# Patient Record
Sex: Male | Born: 1950 | Race: White | Hispanic: No | Marital: Married | Smoking: Never smoker
Health system: Southern US, Community
[De-identification: ages and names within clinical notes are randomized; demographics above are authoritative.]

## PROBLEM LIST (undated history)

## (undated) DIAGNOSIS — K219 Gastro-esophageal reflux disease without esophagitis: Secondary | ICD-10-CM

## (undated) DIAGNOSIS — T7840XA Allergy, unspecified, initial encounter: Secondary | ICD-10-CM

## (undated) DIAGNOSIS — I10 Essential (primary) hypertension: Secondary | ICD-10-CM

## (undated) DIAGNOSIS — H409 Unspecified glaucoma: Secondary | ICD-10-CM

## (undated) HISTORY — PX: APPENDECTOMY: SHX54

## (undated) HISTORY — PX: HERNIA REPAIR: SHX51

## (undated) HISTORY — DX: Unspecified glaucoma: H40.9

## (undated) HISTORY — DX: Essential (primary) hypertension: I10

## (undated) HISTORY — DX: Allergy, unspecified, initial encounter: T78.40XA

## (undated) HISTORY — DX: Gastro-esophageal reflux disease without esophagitis: K21.9

---

## 2000-03-17 ENCOUNTER — Encounter: Payer: Self-pay | Admitting: Internal Medicine

## 2000-03-17 ENCOUNTER — Ambulatory Visit (HOSPITAL_COMMUNITY): Admission: RE | Admit: 2000-03-17 | Discharge: 2000-03-17 | Payer: Self-pay | Admitting: Internal Medicine

## 2014-09-03 ENCOUNTER — Other Ambulatory Visit (INDEPENDENT_AMBULATORY_CARE_PROVIDER_SITE_OTHER): Payer: 59

## 2014-09-03 ENCOUNTER — Telehealth: Payer: Self-pay

## 2014-09-03 ENCOUNTER — Encounter: Payer: Self-pay | Admitting: Internal Medicine

## 2014-09-03 ENCOUNTER — Ambulatory Visit (INDEPENDENT_AMBULATORY_CARE_PROVIDER_SITE_OTHER): Payer: 59 | Admitting: Internal Medicine

## 2014-09-03 VITALS — BP 132/84 | HR 55 | Temp 97.9°F | Resp 16 | Wt 203.0 lb

## 2014-09-03 DIAGNOSIS — R202 Paresthesia of skin: Secondary | ICD-10-CM | POA: Diagnosis not present

## 2014-09-03 DIAGNOSIS — Z23 Encounter for immunization: Secondary | ICD-10-CM

## 2014-09-03 DIAGNOSIS — N529 Male erectile dysfunction, unspecified: Secondary | ICD-10-CM | POA: Diagnosis not present

## 2014-09-03 DIAGNOSIS — Z0000 Encounter for general adult medical examination without abnormal findings: Secondary | ICD-10-CM

## 2014-09-03 LAB — COMPREHENSIVE METABOLIC PANEL
ALBUMIN: 4.5 g/dL (ref 3.5–5.2)
ALT: 19 U/L (ref 0–53)
AST: 20 U/L (ref 0–37)
Alkaline Phosphatase: 55 U/L (ref 39–117)
BUN: 19 mg/dL (ref 6–23)
CHLORIDE: 106 meq/L (ref 96–112)
CO2: 29 meq/L (ref 19–32)
CREATININE: 1.07 mg/dL (ref 0.40–1.50)
Calcium: 9.6 mg/dL (ref 8.4–10.5)
GFR: 74.09 mL/min (ref >60.00)
GLUCOSE: 130 mg/dL — AB (ref 70–99)
POTASSIUM: 4.6 meq/L (ref 3.5–5.1)
SODIUM: 142 meq/L (ref 135–145)
Total Bilirubin: 0.6 mg/dL (ref 0.2–1.2)
Total Protein: 7.4 g/dL (ref 6.0–8.3)

## 2014-09-03 LAB — CBC WITH DIFFERENTIAL/PLATELET
BASOS PCT: 0.4 % (ref 0.0–3.0)
Basophils Absolute: 0 10*3/uL (ref 0.0–0.1)
EOS ABS: 0.1 10*3/uL (ref 0.0–0.7)
EOS PCT: 1.8 % (ref 0.0–5.0)
HCT: 47.9 % (ref 39.0–52.0)
Hemoglobin: 16.2 g/dL (ref 13.0–17.0)
LYMPHS ABS: 1.8 10*3/uL (ref 0.7–4.0)
Lymphocytes Relative: 29.7 % (ref 12.0–46.0)
MCHC: 33.9 g/dL (ref 30.0–36.0)
MCV: 88.8 fl (ref 78.0–100.0)
MONO ABS: 0.6 10*3/uL (ref 0.1–1.0)
Monocytes Relative: 9.1 % (ref 3.0–12.0)
NEUTROS ABS: 3.6 10*3/uL (ref 1.4–7.7)
Neutrophils Relative %: 59 % (ref 43.0–77.0)
PLATELETS: 217 10*3/uL (ref 150.0–400.0)
RBC: 5.4 Mil/uL (ref 4.22–5.81)
RDW: 13 % (ref 11.5–15.5)
WBC: 6.1 10*3/uL (ref 4.0–10.5)

## 2014-09-03 LAB — URINALYSIS, ROUTINE W REFLEX MICROSCOPIC
Bilirubin Urine: NEGATIVE
HGB URINE DIPSTICK: NEGATIVE
Ketones, ur: NEGATIVE
Leukocytes, UA: NEGATIVE
Nitrite: NEGATIVE
RBC / HPF: NONE SEEN (ref 0–?)
SPECIFIC GRAVITY, URINE: 1.025 (ref 1.000–1.030)
TOTAL PROTEIN, URINE-UPE24: NEGATIVE
URINE GLUCOSE: NEGATIVE
Urobilinogen, UA: 0.2 (ref 0.0–1.0)
pH: 5.5 (ref 5.0–8.0)

## 2014-09-03 LAB — SEDIMENTATION RATE: SED RATE: 12 mm/h (ref 0–22)

## 2014-09-03 LAB — LIPID PANEL
Cholesterol: 212 mg/dL — ABNORMAL HIGH (ref 0–200)
HDL: 42.4 mg/dL (ref >39.00)
LDL Cholesterol: 145 mg/dL — ABNORMAL HIGH (ref 0–99)
NONHDL: 169.68
Total CHOL/HDL Ratio: 5
Triglycerides: 123 mg/dL (ref 0.0–149.0)
VLDL: 24.6 mg/dL (ref 0.0–40.0)

## 2014-09-03 LAB — TESTOSTERONE: Testosterone: 435.71 ng/dL (ref 300.00–890.00)

## 2014-09-03 LAB — VITAMIN B12: VITAMIN B 12: 620 pg/mL (ref 211–911)

## 2014-09-03 LAB — PSA: PSA: 1.87 ng/mL (ref 0.10–4.00)

## 2014-09-03 LAB — VITAMIN D 25 HYDROXY (VIT D DEFICIENCY, FRACTURES): VITD: 74.41 ng/mL (ref 30.00–100.00)

## 2014-09-03 LAB — TSH: TSH: 1.4 u[IU]/mL (ref 0.35–4.50)

## 2014-09-03 MED ORDER — VARDENAFIL HCL 20 MG PO TABS
20.0000 mg | ORAL_TABLET | Freq: Every day | ORAL | Status: DC | PRN
Start: 2014-09-03 — End: 2017-05-01

## 2014-09-03 MED ORDER — VITAMIN D 1000 UNITS PO TABS: 1000.0000 [IU] | ORAL_TABLET | Freq: Every day | ORAL | Status: AC

## 2014-09-03 NOTE — Telephone Encounter (Signed)
Lab called and needed labs entered for this pt.   Labs are entered - they were able to tell me the labs that were written by PCP.   RE: [facility] was not connected for a part of the morning, ie, computer issues.

## 2014-09-03 NOTE — Progress Notes (Signed)
Pre visit review using our clinic review tool, if applicable. No additional management support is needed unless otherwise documented below in the visit note. 

## 2014-09-03 NOTE — Assessment & Plan Note (Signed)
X 2 years Levitra prn Labs

## 2014-09-03 NOTE — Progress Notes (Signed)
Subjective:     Patient ID: Brent Wright, male   DOB: 1950/09/13, 64 y.o.   MRN: [id number]  HPI   Well exam - new pt (not seen in >10 years) C/o "cold feet" sensation x 2 years C/o ED x 2-3 years   Review of Systems  Constitutional: Positive for diaphoresis. Negative for appetite change, fatigue and unexpected weight change.  HENT: Negative for congestion, facial swelling, nosebleeds, sneezing, sore throat, trouble swallowing and voice change.   Eyes: Negative for redness, itching and visual disturbance.  Respiratory: Negative for cough and wheezing.   Cardiovascular: Negative for chest pain, palpitations and leg swelling.  Gastrointestinal: Negative for nausea, vomiting, diarrhea, blood in stool and abdominal distention.  Genitourinary: Negative for dysuria, frequency, hematuria, decreased urine volume, penile swelling, scrotal swelling, enuresis, penile pain and testicular pain.  Musculoskeletal: Negative for back pain, joint swelling, gait problem and neck pain.  Skin: Negative for color change, pallor, rash and wound.  Neurological: Negative for dizziness, tremors, speech difficulty, weakness and light-headedness.  Psychiatric/Behavioral: Negative for suicidal ideas, confusion, sleep disturbance, dysphoric mood, decreased concentration and agitation. The patient is not nervous/anxious.        Objective:   Physical Exam  Constitutional: He is oriented to person, place, and time. He appears well-developed and well-nourished. No distress.  HENT:  Head: Normocephalic and atraumatic.  Right Ear: External ear normal.  Left Ear: External ear normal.  Nose: Nose normal.  Mouth/Throat: Oropharynx is clear and moist. No oropharyngeal exudate.  Eyes: Conjunctivae and EOM are normal. Pupils are equal, round, and reactive to light. Right eye exhibits no discharge. Left eye exhibits no discharge. No scleral icterus.  Neck: Normal range of motion. Neck supple. No JVD present. No tracheal  deviation present. No thyromegaly present.  Cardiovascular: Normal rate, regular rhythm, normal heart sounds and intact distal pulses.  Exam reveals no gallop and no friction rub.   No murmur heard. Pulmonary/Chest: Effort normal and breath sounds normal. No stridor. No respiratory distress. He has no wheezes. He has no rales. He exhibits no tenderness.  Abdominal: Soft. Bowel sounds are normal. He exhibits no distension and no mass. There is no tenderness. There is no rebound and no guarding.  Genitourinary: Rectum normal and penis normal. Guaiac negative stool. No penile tenderness.  Prostate 1+ Testes - a little atrophic  Musculoskeletal: Normal range of motion. He exhibits no edema or tenderness.  Lymphadenopathy:    He has no cervical adenopathy.  Neurological: He is alert and oriented to person, place, and time. He has normal reflexes. No cranial nerve deficit. He exhibits normal muscle tone. Coordination normal.  Skin: Skin is warm and dry. No rash noted. He is not diaphoretic. No erythema. No pallor.  Psychiatric: He has a normal mood and affect. His behavior is normal. Judgment and thought content normal.       Assessment:         Plan:

## 2014-09-03 NOTE — Assessment & Plan Note (Signed)
2016 poss neuropathy ?etiology Labs

## 2014-09-04 ENCOUNTER — Encounter: Payer: Self-pay | Admitting: Internal Medicine

## 2014-09-04 NOTE — Assessment & Plan Note (Signed)
We discussed age appropriate health related issues, including available/recomended screening tests and vaccinations. We discussed a need for adhering to healthy diet and exercise. Labs/EKG were reviewed/ordered. All questions were answered. Colonoscopy vs cologuard discussed

## 2017-05-01 ENCOUNTER — Ambulatory Visit (INDEPENDENT_AMBULATORY_CARE_PROVIDER_SITE_OTHER)
Admission: RE | Admit: 2017-05-01 | Discharge: 2017-05-01 | Disposition: A | Discharge status: Home / Self Care | Payer: Medicare Other | Source: Ambulatory Visit | Attending: Internal Medicine | Admitting: Internal Medicine

## 2017-05-01 ENCOUNTER — Ambulatory Visit (INDEPENDENT_AMBULATORY_CARE_PROVIDER_SITE_OTHER): Payer: Medicare Other | Admitting: Internal Medicine

## 2017-05-01 ENCOUNTER — Encounter: Payer: Self-pay | Admitting: Internal Medicine

## 2017-05-01 ENCOUNTER — Other Ambulatory Visit (INDEPENDENT_AMBULATORY_CARE_PROVIDER_SITE_OTHER): Payer: Medicare Other

## 2017-05-01 VITALS — BP 142/86 | HR 60 | Temp 97.9°F | Ht 69.0 in | Wt 201.0 lb

## 2017-05-01 DIAGNOSIS — R0789 Other chest pain: Secondary | ICD-10-CM

## 2017-05-01 DIAGNOSIS — M542 Cervicalgia: Secondary | ICD-10-CM

## 2017-05-01 DIAGNOSIS — R079 Chest pain, unspecified: Secondary | ICD-10-CM | POA: Diagnosis not present

## 2017-05-01 LAB — CBC WITH DIFFERENTIAL/PLATELET
BASOS ABS: 0.1 10*3/uL (ref 0.0–0.1)
Basophils Relative: 0.7 % (ref 0.0–3.0)
EOS ABS: 0.1 10*3/uL (ref 0.0–0.7)
Eosinophils Relative: 1.7 % (ref 0.0–5.0)
HCT: 43.7 % (ref 39.0–52.0)
Hemoglobin: 14.9 g/dL (ref 13.0–17.0)
LYMPHS ABS: 2.3 10*3/uL (ref 0.7–4.0)
Lymphocytes Relative: 30.7 % (ref 12.0–46.0)
MCHC: 34.1 g/dL (ref 30.0–36.0)
MCV: 87.8 fl (ref 78.0–100.0)
MONO ABS: 0.6 10*3/uL (ref 0.1–1.0)
Monocytes Relative: 8.4 % (ref 3.0–12.0)
NEUTROS PCT: 58.5 % (ref 43.0–77.0)
Neutro Abs: 4.4 10*3/uL (ref 1.4–7.7)
Platelets: 206 10*3/uL (ref 150.0–400.0)
RBC: 4.98 Mil/uL (ref 4.22–5.81)
RDW: 13.2 % (ref 11.5–15.5)
WBC: 7.5 10*3/uL (ref 4.0–10.5)

## 2017-05-01 LAB — URINALYSIS
Bilirubin Urine: NEGATIVE
Hgb urine dipstick: NEGATIVE
Ketones, ur: NEGATIVE
LEUKOCYTES UA: NEGATIVE
Nitrite: NEGATIVE
PH: 5 (ref 5.0–8.0)
Total Protein, Urine: NEGATIVE
UROBILINOGEN UA: 0.2 (ref 0.0–1.0)
Urine Glucose: NEGATIVE

## 2017-05-01 LAB — H. PYLORI ANTIBODY, IGG: H Pylori IgG: POSITIVE — AB

## 2017-05-01 MED ORDER — PANTOPRAZOLE SODIUM 40 MG PO TBEC: 40.0000 mg | DELAYED_RELEASE_TABLET | Freq: Every day | ORAL | 3 refills | Status: DC
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
## 2017-05-01 NOTE — Assessment & Plan Note (Addendum)
?  etiology EKG CXR Labs incl H pylori Stress ECHO  ASA 81 mg/d Poss MSK vs GERD: Protonix empirically RTC 6 weeks To ER if worse

## 2017-05-02 LAB — LIPID PANEL
CHOLESTEROL: 181 mg/dL (ref 0–200)
HDL: 42.7 mg/dL (ref >39.00)
LDL CALC: 117 mg/dL — AB (ref 0–99)
NonHDL: 138.47
Total CHOL/HDL Ratio: 4
Triglycerides: 106 mg/dL (ref 0.0–149.0)
VLDL: 21.2 mg/dL (ref 0.0–40.0)

## 2017-05-02 LAB — TSH: TSH: 1.37 u[IU]/mL (ref 0.35–4.50)

## 2017-05-02 LAB — HEPATIC FUNCTION PANEL
ALK PHOS: 55 U/L (ref 39–117)
ALT: 18 U/L (ref 0–53)
AST: 18 U/L (ref 0–37)
Albumin: 4.3 g/dL (ref 3.5–5.2)
Bilirubin, Direct: 0 mg/dL (ref 0.0–0.3)
TOTAL PROTEIN: 7.5 g/dL (ref 6.0–8.3)
Total Bilirubin: 0.5 mg/dL (ref 0.2–1.2)

## 2017-05-02 LAB — BASIC METABOLIC PANEL
BUN: 22 mg/dL (ref 6–23)
CHLORIDE: 104 meq/L (ref 96–112)
CO2: 23 meq/L (ref 19–32)
Calcium: 9.4 mg/dL (ref 8.4–10.5)
Creatinine, Ser: 1.23 mg/dL (ref 0.40–1.50)
GFR: 62.56 mL/min (ref >60.00)
Glucose, Bld: 114 mg/dL — ABNORMAL HIGH (ref 70–99)
POTASSIUM: 4.1 meq/L (ref 3.5–5.1)
SODIUM: 139 meq/L (ref 135–145)

## 2017-05-07 ENCOUNTER — Other Ambulatory Visit: Payer: Self-pay | Admitting: Internal Medicine

## 2017-05-07 MED ORDER — CLARITHROMYCIN 500 MG PO TABS: 500.0000 mg | ORAL_TABLET | Freq: Two times a day (BID) | ORAL | 0 refills | Status: AC

## 2017-05-07 MED ORDER — AMOXICILLIN 500 MG PO CAPS: 1000.0000 mg | ORAL_CAPSULE | Freq: Two times a day (BID) | ORAL | 0 refills | Status: DC

## 2017-05-07 MED ORDER — PANTOPRAZOLE SODIUM 40 MG PO TBEC: 40.0000 mg | DELAYED_RELEASE_TABLET | Freq: Two times a day (BID) | ORAL | 1 refills | Status: DC

## 2017-05-09 ENCOUNTER — Telehealth: Payer: Self-pay

## 2017-05-09 NOTE — Telephone Encounter (Signed)
-----   Message from Cassandria Anger, MD sent at 05/07/2017 11:15 PM EDT ----- Regarding: rx Inez Catalina, Please fax his Rx x3 to his drug store (none on file) Thanks, AP

## 2017-05-09 NOTE — Telephone Encounter (Signed)
LM for pt to call back.  Where does pt want RXs to go or does he ant to pick up Rxs

## 2017-05-11 ENCOUNTER — Telehealth (HOSPITAL_COMMUNITY): Payer: Self-pay | Admitting: *Deleted

## 2017-05-11 NOTE — Telephone Encounter (Signed)
Patient given detailed instructions per Stress Test Requisition Sheet for test on 05/18/17 at 7:30.Patient Notified to arrive 30 minutes early, and that it is imperative to arrive on time for appointment to keep from having the test rescheduled.  Patient verbalized understanding. Brent Wright

## 2017-05-18 ENCOUNTER — Other Ambulatory Visit: Payer: Self-pay

## 2017-05-18 ENCOUNTER — Ambulatory Visit (HOSPITAL_COMMUNITY): Payer: Medicare Other

## 2017-05-18 ENCOUNTER — Ambulatory Visit (HOSPITAL_COMMUNITY): Payer: Medicare Other | Attending: Cardiology

## 2017-05-18 DIAGNOSIS — R0789 Other chest pain: Secondary | ICD-10-CM | POA: Diagnosis not present

## 2017-05-18 DIAGNOSIS — M542 Cervicalgia: Secondary | ICD-10-CM | POA: Diagnosis not present

## 2017-05-18 DIAGNOSIS — I10 Essential (primary) hypertension: Secondary | ICD-10-CM | POA: Diagnosis not present

## 2017-05-18 DIAGNOSIS — R079 Chest pain, unspecified: Secondary | ICD-10-CM | POA: Diagnosis not present

## 2017-05-19 ENCOUNTER — Encounter (INDEPENDENT_AMBULATORY_CARE_PROVIDER_SITE_OTHER): Payer: Self-pay

## 2017-06-21 DIAGNOSIS — H43813 Vitreous degeneration, bilateral: Secondary | ICD-10-CM | POA: Diagnosis not present

## 2017-06-21 DIAGNOSIS — H2513 Age-related nuclear cataract, bilateral: Secondary | ICD-10-CM | POA: Diagnosis not present

## 2017-06-21 DIAGNOSIS — H5213 Myopia, bilateral: Secondary | ICD-10-CM | POA: Diagnosis not present

## 2017-06-21 DIAGNOSIS — H40023 Open angle with borderline findings, high risk, bilateral: Secondary | ICD-10-CM | POA: Diagnosis not present

## 2017-06-21 DIAGNOSIS — H40053 Ocular hypertension, bilateral: Secondary | ICD-10-CM | POA: Diagnosis not present

## 2017-06-23 ENCOUNTER — Ambulatory Visit (INDEPENDENT_AMBULATORY_CARE_PROVIDER_SITE_OTHER): Payer: Medicare Other | Admitting: Internal Medicine

## 2017-06-23 ENCOUNTER — Encounter: Payer: Self-pay | Admitting: Internal Medicine

## 2017-06-23 VITALS — BP 138/82 | HR 54 | Temp 98.3°F | Ht 69.0 in | Wt 196.0 lb

## 2017-06-23 DIAGNOSIS — Z23 Encounter for immunization: Secondary | ICD-10-CM

## 2017-06-23 DIAGNOSIS — R0789 Other chest pain: Secondary | ICD-10-CM | POA: Diagnosis not present

## 2017-06-23 DIAGNOSIS — A048 Other specified bacterial intestinal infections: Secondary | ICD-10-CM | POA: Diagnosis not present

## 2017-06-23 MED ORDER — VITAMIN D3 50 MCG (2000 UT) PO CAPS: 2000.0000 [IU] | ORAL_CAPSULE | Freq: Every day | ORAL | 3 refills | Status: AC

## 2017-06-23 NOTE — Assessment & Plan Note (Signed)
Resolved w/H pylori Rx

## 2017-06-23 NOTE — Progress Notes (Signed)
Subjective:  Patient ID: Brent Wright, male    DOB: 11-Oct-1950  Age: 67 y.o. MRN: [id number]  CC: No chief complaint on file.   HPI Brent Wright presents for H pylori, CP - much better after abx   Outpatient Medications Prior to Visit  Medication Sig Dispense Refill  . aspirin 81 MG tablet Take 81 mg by mouth daily.    . pantoprazole (PROTONIX) 40 MG tablet Take 1 tablet (40 mg total) by mouth 2 (two) times daily. 60 tablet 1  . amoxicillin (AMOXIL) 500 MG capsule Take 2 capsules (1,000 mg total) by mouth 2 (two) times daily. 56 capsule 0   No facility-administered medications prior to visit.     ROS: Review of Systems  Constitutional: Negative for appetite change, fatigue and unexpected weight change.  HENT: Negative for congestion, nosebleeds, sneezing, sore throat and trouble swallowing.   Eyes: Negative for itching and visual disturbance.  Respiratory: Negative for cough.   Cardiovascular: Negative for chest pain, palpitations and leg swelling.  Gastrointestinal: Negative for abdominal distention, blood in stool, diarrhea and nausea.  Genitourinary: Negative for frequency and hematuria.  Musculoskeletal: Negative for back pain, gait problem, joint swelling and neck pain.  Skin: Negative for rash.  Neurological: Negative for dizziness, tremors, speech difficulty and weakness.  Psychiatric/Behavioral: Negative for agitation, dysphoric mood and sleep disturbance. The patient is not nervous/anxious.     Objective:  BP 138/82 (BP Location: Left Arm, Patient Position: Sitting, Cuff Size: Large)   Pulse (!) 54   Temp 98.3 F (36.8 C) (Oral)   Ht 5\' 9"  (1.753 m)   Wt 196 lb (88.9 kg)   SpO2 98%   BMI 28.94 kg/m   BP Readings from Last 3 Encounters:  06/23/17 138/82  05/01/17 (!) 142/86  09/03/14 132/84    Wt Readings from Last 3 Encounters:  06/23/17 196 lb (88.9 kg)  05/01/17 201 lb (91.2 kg)  09/03/14 203 lb (92.1 kg)    Physical Exam  Constitutional: He is  oriented to person, place, and time. He appears well-developed. No distress.  NAD  HENT:  Mouth/Throat: Oropharynx is clear and moist.  Eyes: Pupils are equal, round, and reactive to light. Conjunctivae are normal.  Neck: Normal range of motion. No JVD present. No thyromegaly present.  Cardiovascular: Normal rate, regular rhythm, normal heart sounds and intact distal pulses. Exam reveals no gallop and no friction rub.  No murmur heard. Pulmonary/Chest: Effort normal and breath sounds normal. No respiratory distress. He has no wheezes. He has no rales. He exhibits no tenderness.  Abdominal: Soft. Bowel sounds are normal. He exhibits no distension and no mass. There is no tenderness. There is no rebound and no guarding.  Musculoskeletal: Normal range of motion. He exhibits no edema or tenderness.  Lymphadenopathy:    He has no cervical adenopathy.  Neurological: He is alert and oriented to person, place, and time. He has normal reflexes. No cranial nerve deficit. He exhibits normal muscle tone. He displays a negative Romberg sign. Coordination and gait normal.  Skin: Skin is warm and dry. No rash noted.  Psychiatric: He has a normal mood and affect. His behavior is normal. Judgment and thought content normal.    Lab Results  Component Value Date   WBC 7.5 05/01/2017   HGB 14.9 05/01/2017   HCT 43.7 05/01/2017   PLT 206.0 05/01/2017   GLUCOSE 114 (H) 05/01/2017   CHOL 181 05/01/2017   TRIG 106.0 05/01/2017   HDL 42.70 05/01/2017  LDLCALC 117 (H) 05/01/2017   ALT 18 05/01/2017   AST 18 05/01/2017   NA 139 05/01/2017   K 4.1 05/01/2017   CL 104 05/01/2017   CREATININE 1.23 05/01/2017   BUN 22 05/01/2017   CO2 23 05/01/2017   TSH 1.37 05/01/2017   PSA 1.87 09/03/2014    Dg Chest 2 View  Result Date: 05/02/2017 CLINICAL DATA:  Chest pain for 1 month EXAM: CHEST - 2 VIEW COMPARISON:  None. FINDINGS: Hyperinflation. No focal airspace disease or effusion. Normal heart size. No  pneumothorax. IMPRESSION: No active cardiopulmonary disease. Electronically Signed   By: Donavan Foil M.D.   On: 05/02/2017 03:42    Assessment & Plan:   There are no diagnoses linked to this encounter.   No orders of the defined types were placed in this encounter.    Follow-up: No follow-ups on file.  Walker Kehr, MD

## 2017-06-23 NOTE — Assessment & Plan Note (Signed)
treated

## 2017-06-23 NOTE — Addendum Note (Signed)
Addended by: Karren Cobble on: 06/23/2017 11:19 AM   Modules accepted: Orders

## 2017-07-20 ENCOUNTER — Other Ambulatory Visit: Payer: Self-pay | Admitting: Internal Medicine

## 2017-11-22 ENCOUNTER — Encounter: Payer: Self-pay | Admitting: Internal Medicine

## 2017-11-22 ENCOUNTER — Ambulatory Visit (INDEPENDENT_AMBULATORY_CARE_PROVIDER_SITE_OTHER): Payer: Medicare Other | Admitting: Internal Medicine

## 2017-11-22 DIAGNOSIS — K4090 Unilateral inguinal hernia, without obstruction or gangrene, not specified as recurrent: Secondary | ICD-10-CM

## 2017-11-22 NOTE — Progress Notes (Signed)
Subjective:  Patient ID: Brent Wright, male    DOB: 1950-02-28  Age: 67 y.o. MRN: [id number]  CC: No chief complaint on file.   HPI Akaash Vandewater presents for L groin swelling off and on x2 wks NT  Outpatient Medications Prior to Visit  Medication Sig Dispense Refill  . Cholecalciferol (VITAMIN D3) 2000 units capsule Take 1 capsule (2,000 Units total) by mouth daily. 100 capsule 3  . pantoprazole (PROTONIX) 40 MG tablet TAKE 1 TABLET BY MOUTH TWICE A DAY 60 tablet 1   No facility-administered medications prior to visit.     ROS: Review of Systems  Constitutional: Negative for appetite change, fatigue and unexpected weight change.  HENT: Negative for congestion, nosebleeds, sneezing, sore throat and trouble swallowing.   Eyes: Negative for itching and visual disturbance.  Respiratory: Negative for cough.   Cardiovascular: Negative for chest pain, palpitations and leg swelling.  Gastrointestinal: Negative for abdominal distention, abdominal pain, blood in stool, diarrhea, nausea and vomiting.  Genitourinary: Negative for frequency and hematuria.  Musculoskeletal: Negative for back pain, gait problem, joint swelling and neck pain.  Skin: Negative for rash.  Neurological: Negative for dizziness, tremors, speech difficulty and weakness.  Psychiatric/Behavioral: Negative for agitation, dysphoric mood and sleep disturbance. The patient is not nervous/anxious.     Objective:  BP 136/82 (BP Location: Left Arm, Patient Position: Sitting, Cuff Size: Large)   Pulse (!) 56   Temp 97.7 F (36.5 C) (Oral)   Ht 5\' 9"  (1.753 m)   Wt 194 lb (88 kg)   SpO2 98%   BMI 28.65 kg/m   BP Readings from Last 3 Encounters:  11/22/17 136/82  06/23/17 138/82  05/01/17 (!) 142/86    Wt Readings from Last 3 Encounters:  11/22/17 194 lb (88 kg)  06/23/17 196 lb (88.9 kg)  05/01/17 201 lb (91.2 kg)    Physical Exam  Constitutional: He is oriented to person, place, and time. He appears  well-developed. No distress.  NAD  HENT:  Mouth/Throat: Oropharynx is clear and moist.  Eyes: Pupils are equal, round, and reactive to light. Conjunctivae are normal.  Neck: Normal range of motion. No JVD present. No thyromegaly present.  Cardiovascular: Normal rate, regular rhythm, normal heart sounds and intact distal pulses. Exam reveals no gallop and no friction rub.  No murmur heard. Pulmonary/Chest: Effort normal and breath sounds normal. No respiratory distress. He has no wheezes. He has no rales. He exhibits no tenderness.  Abdominal: Soft. Bowel sounds are normal. He exhibits no distension and no mass. There is no tenderness. There is no rebound and no guarding. A hernia is present.  Musculoskeletal: Normal range of motion. He exhibits no edema or tenderness.  Lymphadenopathy:    He has no cervical adenopathy.  Neurological: He is alert and oriented to person, place, and time. He has normal reflexes. No cranial nerve deficit. He exhibits normal muscle tone. He displays a negative Romberg sign. Coordination and gait normal.  Skin: Skin is warm and dry. No rash noted.  Psychiatric: He has a normal mood and affect. His behavior is normal. Judgment and thought content normal.   L ing hernia 4x5 cm NT   Lab Results  Component Value Date   WBC 7.5 05/01/2017   HGB 14.9 05/01/2017   HCT 43.7 05/01/2017   PLT 206.0 05/01/2017   GLUCOSE 114 (H) 05/01/2017   CHOL 181 05/01/2017   TRIG 106.0 05/01/2017   HDL 42.70 05/01/2017   LDLCALC 117 (H) 05/01/2017  ALT 18 05/01/2017   AST 18 05/01/2017   NA 139 05/01/2017   K 4.1 05/01/2017   CL 104 05/01/2017   CREATININE 1.23 05/01/2017   BUN 22 05/01/2017   CO2 23 05/01/2017   TSH 1.37 05/01/2017   PSA 1.87 09/03/2014    Dg Chest 2 View  Result Date: 05/02/2017 CLINICAL DATA:  Chest pain for 1 month EXAM: CHEST - 2 VIEW COMPARISON:  None. FINDINGS: Hyperinflation. No focal airspace disease or effusion. Normal heart size. No  pneumothorax. IMPRESSION: No active cardiopulmonary disease. Electronically Signed   By: Donavan Foil M.D.   On: 05/02/2017 03:42    Assessment & Plan:   There are no diagnoses linked to this encounter.   No orders of the defined types were placed in this encounter.    Follow-up: No follow-ups on file.  Walker Kehr, MD

## 2017-11-22 NOTE — Patient Instructions (Signed)
Inguinal Hernia, Adult An inguinal hernia is when fat or the intestines push through the area where the leg meets the lower belly (groin) and make a rounded lump (bulge). This condition happens over time. There are three types of inguinal hernias. These types include:  Hernias that can be pushed back into the belly (are reducible).  Hernias that cannot be pushed back into the belly (are incarcerated).  Hernias that cannot be pushed back into the belly and lose their blood supply (get strangulated). This type needs emergency surgery.  Follow these instructions at home: Lifestyle  Drink enough fluid to keep your urine (pee) clear or pale yellow.  Eat plenty of fruits, vegetables, and whole grains. These have a lot of fiber. Talk with your doctor if you have questions.  Avoid lifting heavy objects.  Avoid standing for long periods of time.  Do not use tobacco products. These include cigarettes, chewing tobacco, or e-cigarettes. If you need help quitting, ask your doctor.  Try to stay at a healthy weight. General instructions  Do not try to force the hernia back in.  Watch your hernia for any changes in color or size. Let your doctor know if there are any changes.  Take over-the-counter and prescription medicines only as told by your doctor.  Keep all follow-up visits as told by your doctor. This is important. Contact a doctor if:  You have a fever.  You have new symptoms.  Your symptoms get worse. Get help right away if:  The area where the legs meets the lower belly has: ? Pain that gets worse suddenly. ? A bulge that gets bigger suddenly and does not go down. ? A bulge that turns red or purple. ? A bulge that is painful to the touch.  You are a man and your scrotum: ? Suddenly feels painful. ? Suddenly changes in size.  You feel sick to your stomach (nauseous) and this feeling does not go away.  You throw up (vomit) and this keeps happening.  You feel your heart  beating a lot more quickly than normal.  You cannot poop (have a bowel movement) or pass gas. This information is not intended to replace advice given to you by your health care provider. Make sure you discuss any questions you have with your health care provider. Document Released: 02/03/2006 Document Revised: 06/11/2015 Document Reviewed: 11/13/2013 Elsevier Interactive Patient Education  2018 [employer]  

## 2017-11-22 NOTE — Assessment & Plan Note (Signed)
Dr Dalbert Batman surg ref Discussed

## 2017-12-29 DIAGNOSIS — H40053 Ocular hypertension, bilateral: Secondary | ICD-10-CM | POA: Diagnosis not present

## 2017-12-29 DIAGNOSIS — H2513 Age-related nuclear cataract, bilateral: Secondary | ICD-10-CM | POA: Diagnosis not present

## 2017-12-29 DIAGNOSIS — H40023 Open angle with borderline findings, high risk, bilateral: Secondary | ICD-10-CM | POA: Diagnosis not present

## 2017-12-29 DIAGNOSIS — H5213 Myopia, bilateral: Secondary | ICD-10-CM | POA: Diagnosis not present

## 2017-12-29 DIAGNOSIS — H43813 Vitreous degeneration, bilateral: Secondary | ICD-10-CM | POA: Diagnosis not present

## 2018-01-24 ENCOUNTER — Other Ambulatory Visit: Payer: Self-pay | Admitting: General Surgery

## 2018-01-24 DIAGNOSIS — Z9049 Acquired absence of other specified parts of digestive tract: Secondary | ICD-10-CM | POA: Diagnosis not present

## 2018-01-24 DIAGNOSIS — K219 Gastro-esophageal reflux disease without esophagitis: Secondary | ICD-10-CM | POA: Diagnosis not present

## 2018-01-24 DIAGNOSIS — K4090 Unilateral inguinal hernia, without obstruction or gangrene, not specified as recurrent: Secondary | ICD-10-CM | POA: Diagnosis not present

## 2018-01-25 ENCOUNTER — Other Ambulatory Visit: Payer: Self-pay

## 2018-01-25 ENCOUNTER — Encounter (HOSPITAL_BASED_OUTPATIENT_CLINIC_OR_DEPARTMENT_OTHER): Payer: Self-pay | Admitting: *Deleted

## 2018-01-28 NOTE — H&P (Signed)
Brent Wright Location: [facility] Patient #: [id number] DOB: 1950-12-29 Married / Language: English / Race: White Male       History of Present Illness       [name], generally healthy 68 year old man, referred by Dr. Alain Marion for evaluation of symptomatic left inguinal hernia. His son is with him throughout the encounter.      He is an immigrant from [place] has been here for many years. 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 About 3 months ago he noticed a bulge in his left groin. He cannot push this all the way back in. It is a little uncomfortable. No other complications or problems.     Comorbidities include history open appendectomy in [place]. Treated for H. pylori with good resolution of symptoms. He still takes Protonix. Otherwise healthy Family history reveals mother had some type of cancer but lived for 74 years and died of other causes a 41. Father died of myocardial infarction Social history reveals he is married. His wife is generally healthy. He has 2 children. He is from [place]. Denies tobacco. Takes alcohol occasionally. 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 Lives in [place].     He wants to have the hernia repaired now. That is reasonable considering the onset of symptoms and the fact that it is only partially reducible He'll be scheduled for open repair of left inguinal hernia with mesh. I discussed the indications, details, techniques, and numerous risk of the surgery with him and his son. He is aware of the risks of bleeding, infection, recurrence, chronic pain. Rare incidence of injury to the testicle intestine or bladder with major reconstructive surgery. He is aware of the laparoscopic approach. He has decided on the open repair. Request TAPP block We will schedule in the near future.   Past Surgical History  Appendectomy   Diagnostic Studies History  Colonoscopy   never  Allergies  No Known Allergies  No Known Drug Allergies Allergies Reconciled   Medication History  No Current Medications Medications Reconciled  Social History  Alcohol use  Occasional alcohol use. Caffeine use  Coffee. No drug use  Tobacco use  Never smoker.  Family History First Degree Relatives  No pertinent family history   Other Problems  Other disease, cancer, significant illness     Review of Systems  General Not Present- Appetite Loss, Chills, Fatigue, Fever, Night Sweats, Weight Gain and Weight Loss. Skin Not Present- Change in Wart/Mole, Dryness, Hives, Jaundice, New Lesions, Non-Healing Wounds, Rash and Ulcer. HEENT Present- Seasonal Allergies. Not Present- Earache, Hearing Loss, Hoarseness, Nose Bleed, Oral Ulcers, Ringing in the Ears, Sinus Pain, Sore Throat, Visual Disturbances, Wears glasses/contact lenses and Yellow Eyes. Respiratory Not Present- Bloody sputum, Chronic Cough, Difficulty Breathing, Snoring and Wheezing. Breast Not Present- Breast Mass, Breast Pain, Nipple Discharge and Skin Changes. Cardiovascular Not Present- Chest Pain, Difficulty Breathing Lying Down, Leg Cramps, Palpitations, Rapid Heart Rate, Shortness of Breath and Swelling of Extremities. Gastrointestinal Not Present- Abdominal Pain, Bloating, Bloody Stool, Change in Bowel Habits, Chronic diarrhea, Constipation, Difficulty Swallowing, Excessive gas, Gets full quickly at meals, Hemorrhoids, Indigestion, Nausea, Rectal Pain and Vomiting. Male Genitourinary Not Present- Blood in Urine, Change in Urinary Stream, Frequency, Impotence, Nocturia, Painful Urination, Urgency and Urine Leakage. Musculoskeletal Not Present- Back Pain, Joint Pain, Joint Stiffness, Muscle Pain, Muscle Weakness and Swelling of Extremities. Neurological Not Present- Decreased Memory, Fainting, Headaches, Numbness, Seizures, Tingling, Tremor, Trouble  walking and Weakness. Psychiatric Not Present- Anxiety,  Bipolar, Change in Sleep Pattern, Depression, Fearful and Frequent crying. Endocrine Not Present- Cold Intolerance, Excessive Hunger, Hair Changes, Heat Intolerance, Hot flashes and New Diabetes. Hematology Not Present- Blood Thinners, Easy Bruising, Excessive bleeding, Gland problems, HIV and Persistent Infections.  Vitals Weight: 202.06 lb Height: 71in Body Surface Area: 2.12 m Body Mass Index: 28.18 kg/m  Temp.: 97.72F(Temporal)  Pulse: 96 (Regular)  BP: 162/98 (Sitting, Right Arm, Standard)       Physical Exam General Mental Status-Alert. General Appearance-Consistent with stated age. Hydration-Well hydrated. Voice-Normal.  Head and Neck Head-normocephalic, atraumatic with no lesions or palpable masses. Trachea-midline. Thyroid Gland Characteristics - normal size and consistency.  Eye Eyeball - Bilateral-Extraocular movements intact. Sclera/Conjunctiva - Bilateral-No scleral icterus.  Chest and Lung Exam Chest and lung exam reveals -quiet, even and easy respiratory effort with no use of accessory muscles and on auscultation, normal breath sounds, no adventitious sounds and normal vocal resonance. Inspection Chest Wall - Normal. Back - normal.   Cardiovascular Cardiovascular examination reveals -normal heart sounds, regular rate and rhythm with no murmurs and normal pedal pulses bilaterally.  Abdomen Inspection Inspection of the abdomen reveals - No Hernias. Skin - Scar - Note: Right lower quadrant McBurney incision. No hernia there. Palpation/Percussion Palpation and Percussion of the abdomen reveal - Soft, Non Tender, No Rebound tenderness, No Rigidity (guarding) and No hepatosplenomegaly. Auscultation Auscultation of the abdomen reveals - Bowel sounds normal.  Male Genitourinary Note: Obvious left inguinal hernia the size of a large egg. Partially reducible. Skin healthy. Only tender when I push hard. No evidence of hernia on  the right. No inguinal adenopathy.   Neurologic Neurologic evaluation reveals -alert and oriented x 3 with no impairment of recent or remote memory. Mental Status-Normal.  Musculoskeletal Normal Exam - Left-Upper Extremity Strength Normal and Lower Extremity Strength Normal. Normal Exam - Right-Upper Extremity Strength Normal and Lower Extremity Strength Normal.  Lymphatic Head & Neck  General Head & Neck Lymphatics: Bilateral - Description - Normal. Axillary  General Axillary Region: Bilateral - Description - Normal. Tenderness - Non Tender. Femoral & Inguinal  Generalized Femoral & Inguinal Lymphatics: Bilateral - Description - Normal. Tenderness - Non Tender.    Assessment & Plan  LEFT INGUINAL HERNIA (K40.90)   You have a left inguinal hernia I can partially reduce this but not completely I do not think there is any entrapped intestine There was no evidence of hernia elsewhere  you requested we proceed with repair. That is appropriate you will be scheduled for open repair of left inguinal hernia with mesh I have discussed the indications, techniques, and risks of the surgery with you and your son  Please read over the patient information booklet that I reviewed with you  HISTORY OF APPENDECTOMY (Z90.49) CHRONIC GERD (K21.9) Impression: Treated for H. pylori with resolution of symptoms    Arayah Krouse M. Dalbert Batman, M.D., [facility] General and Minimally invasive Surgery Breast and Colorectal Surgery Office:   [phone] Pager:   [phone]

## 2018-02-01 ENCOUNTER — Encounter (HOSPITAL_BASED_OUTPATIENT_CLINIC_OR_DEPARTMENT_OTHER): Payer: Self-pay | Admitting: Anesthesiology

## 2018-02-01 NOTE — Anesthesia Preprocedure Evaluation (Addendum)
Anesthesia Evaluation  Patient identified by MRN, date of birth, ID band Patient awake    Reviewed: Allergy & Precautions, NPO status , Patient's Chart, lab work & pertinent test results  Airway Mallampati: I  TM Distance: >3 FB Neck ROM: Full    Dental  (+) Teeth Intact, Chipped,    Pulmonary neg pulmonary ROS,    breath sounds clear to auscultation       Cardiovascular negative cardio ROS   Rhythm:Regular Rate:Normal     Neuro/Psych negative neurological ROS     GI/Hepatic negative GI ROS, Neg liver ROS,   Endo/Other  negative endocrine ROS  Renal/GU negative Renal ROS     Musculoskeletal   Abdominal Normal abdominal exam  (+)   Peds  Hematology negative hematology ROS (+)   Anesthesia Other Findings   Reproductive/Obstetrics                            Anesthesia Physical Anesthesia Plan  ASA: II  Anesthesia Plan: General   Post-op Pain Management: GA combined w/ Regional for post-op pain   Induction: Intravenous  PONV Risk Score and Plan: 3 and Dexamethasone, Ondansetron and Midazolam  Airway Management Planned: Oral ETT  Additional Equipment: None  Intra-op Plan:   Post-operative Plan: Extubation in OR  Informed Consent: I have reviewed the patients History and Physical, chart, labs and discussed the procedure including the risks, benefits and alternatives for the proposed anesthesia with the patient or authorized representative who has indicated his/her understanding and acceptance.     Dental advisory given  Plan Discussed with: CRNA  Anesthesia Plan Comments:        Anesthesia Quick Evaluation

## 2018-02-02 ENCOUNTER — Ambulatory Visit (HOSPITAL_BASED_OUTPATIENT_CLINIC_OR_DEPARTMENT_OTHER)
Admission: RE | Admit: 2018-02-02 | Discharge: 2018-02-02 | Disposition: A | Discharge status: Home / Self Care | Payer: PPO | Attending: General Surgery | Admitting: General Surgery

## 2018-02-02 ENCOUNTER — Other Ambulatory Visit: Payer: Self-pay

## 2018-02-02 ENCOUNTER — Encounter (HOSPITAL_BASED_OUTPATIENT_CLINIC_OR_DEPARTMENT_OTHER)
Admission: RE | Disposition: A | Discharge status: Home / Self Care | Payer: Self-pay | Source: Home / Self Care | Attending: General Surgery

## 2018-02-02 ENCOUNTER — Encounter (HOSPITAL_BASED_OUTPATIENT_CLINIC_OR_DEPARTMENT_OTHER): Payer: Self-pay | Admitting: Anesthesiology

## 2018-02-02 ENCOUNTER — Ambulatory Visit (HOSPITAL_BASED_OUTPATIENT_CLINIC_OR_DEPARTMENT_OTHER): Payer: PPO | Admitting: Certified Registered"

## 2018-02-02 DIAGNOSIS — K219 Gastro-esophageal reflux disease without esophagitis: Secondary | ICD-10-CM | POA: Insufficient documentation

## 2018-02-02 DIAGNOSIS — Z8249 Family history of ischemic heart disease and other diseases of the circulatory system: Secondary | ICD-10-CM | POA: Insufficient documentation

## 2018-02-02 DIAGNOSIS — Z809 Family history of malignant neoplasm, unspecified: Secondary | ICD-10-CM | POA: Insufficient documentation

## 2018-02-02 DIAGNOSIS — G8918 Other acute postprocedural pain: Secondary | ICD-10-CM | POA: Diagnosis not present

## 2018-02-02 DIAGNOSIS — K4090 Unilateral inguinal hernia, without obstruction or gangrene, not specified as recurrent: Secondary | ICD-10-CM

## 2018-02-02 HISTORY — PX: INGUINAL HERNIA REPAIR: SHX194

## 2018-02-02 HISTORY — PX: INSERTION OF MESH: SHX5868

## 2018-02-02 SURGERY — REPAIR, HERNIA, INGUINAL, ADULT
Anesthesia: General | Site: Abdomen | Laterality: Left

## 2018-02-02 MED ORDER — ACETAMINOPHEN 500 MG PO TABS
ORAL_TABLET | ORAL | Status: AC
  Filled 2018-02-02: qty 2

## 2018-02-02 MED ORDER — OXYCODONE HCL 5 MG PO TABS: 5.0000 mg | ORAL_TABLET | Freq: Once | ORAL | Status: DC | PRN

## 2018-02-02 MED ORDER — GABAPENTIN 300 MG PO CAPS
300.0000 mg | ORAL_CAPSULE | ORAL | Status: AC
  Administered 2018-02-02: 300 mg via ORAL

## 2018-02-02 MED ORDER — DEXAMETHASONE SODIUM PHOSPHATE 10 MG/ML IJ SOLN
INTRAMUSCULAR | Status: AC
  Filled 2018-02-02: qty 1

## 2018-02-02 MED ORDER — LACTATED RINGERS IV SOLN
INTRAVENOUS | Status: DC
  Administered 2018-02-02 (×2): via INTRAVENOUS

## 2018-02-02 MED ORDER — MIDAZOLAM HCL 2 MG/2ML IJ SOLN
INTRAMUSCULAR | Status: AC
  Filled 2018-02-02: qty 2

## 2018-02-02 MED ORDER — ACETAMINOPHEN 325 MG PO TABS: 325.0000 mg | ORAL_TABLET | ORAL | Status: DC | PRN

## 2018-02-02 MED ORDER — PROMETHAZINE HCL 25 MG/ML IJ SOLN: 6.2500 mg | INTRAMUSCULAR | Status: DC | PRN

## 2018-02-02 MED ORDER — ROCURONIUM BROMIDE 100 MG/10ML IV SOLN
INTRAVENOUS | Status: DC | PRN
  Administered 2018-02-02: 50 mg via INTRAVENOUS

## 2018-02-02 MED ORDER — BUPIVACAINE-EPINEPHRINE 0.25% -1:200000 IJ SOLN
INTRAMUSCULAR | Status: DC | PRN
  Administered 2018-02-02: 10 mL

## 2018-02-02 MED ORDER — BUPIVACAINE HCL (PF) 0.25 % IJ SOLN
INTRAMUSCULAR | Status: AC
  Filled 2018-02-02: qty 30

## 2018-02-02 MED ORDER — FENTANYL CITRATE (PF) 100 MCG/2ML IJ SOLN
50.0000 ug | INTRAMUSCULAR | Status: AC | PRN
  Administered 2018-02-02: 50 ug via INTRAVENOUS
  Administered 2018-02-02: 25 ug via INTRAVENOUS
  Administered 2018-02-02: 100 ug via INTRAVENOUS
  Administered 2018-02-02: 25 ug via INTRAVENOUS

## 2018-02-02 MED ORDER — DEXAMETHASONE SODIUM PHOSPHATE 4 MG/ML IJ SOLN
INTRAMUSCULAR | Status: DC | PRN
  Administered 2018-02-02: 10 mg via INTRAVENOUS

## 2018-02-02 MED ORDER — FENTANYL CITRATE (PF) 100 MCG/2ML IJ SOLN
INTRAMUSCULAR | Status: AC
  Filled 2018-02-02: qty 2

## 2018-02-02 MED ORDER — HYDROCODONE-ACETAMINOPHEN 5-325 MG PO TABS: 1.0000 | ORAL_TABLET | Freq: Four times a day (QID) | ORAL | 0 refills | Status: DC | PRN

## 2018-02-02 MED ORDER — LIDOCAINE-EPINEPHRINE (PF) 1 %-1:200000 IJ SOLN
INTRAMUSCULAR | Status: AC
  Filled 2018-02-02: qty 30

## 2018-02-02 MED ORDER — GLYCOPYRROLATE PF 0.2 MG/ML IJ SOSY
PREFILLED_SYRINGE | INTRAMUSCULAR | Status: AC
  Filled 2018-02-02: qty 1

## 2018-02-02 MED ORDER — BUPIVACAINE-EPINEPHRINE (PF) 0.5% -1:200000 IJ SOLN
INTRAMUSCULAR | Status: DC | PRN
  Administered 2018-02-02: 30 mL

## 2018-02-02 MED ORDER — SUGAMMADEX SODIUM 200 MG/2ML IV SOLN
INTRAVENOUS | Status: AC
  Filled 2018-02-02: qty 2

## 2018-02-02 MED ORDER — EPHEDRINE SULFATE-NACL 50-0.9 MG/10ML-% IV SOSY
PREFILLED_SYRINGE | INTRAVENOUS | Status: DC | PRN
  Administered 2018-02-02: 10 mg via INTRAVENOUS

## 2018-02-02 MED ORDER — SUGAMMADEX SODIUM 200 MG/2ML IV SOLN
INTRAVENOUS | Status: DC | PRN
  Administered 2018-02-02: 200 mg via INTRAVENOUS

## 2018-02-02 MED ORDER — LIDOCAINE 2% (20 MG/ML) 5 ML SYRINGE
INTRAMUSCULAR | Status: AC
  Filled 2018-02-02: qty 5

## 2018-02-02 MED ORDER — CHLORHEXIDINE GLUCONATE CLOTH 2 % EX PADS: 6.0000 | MEDICATED_PAD | Freq: Once | CUTANEOUS | Status: DC

## 2018-02-02 MED ORDER — ACETAMINOPHEN 160 MG/5ML PO SOLN: 325.0000 mg | ORAL | Status: DC | PRN

## 2018-02-02 MED ORDER — BUPIVACAINE HCL (PF) 0.5 % IJ SOLN
INTRAMUSCULAR | Status: AC
  Filled 2018-02-02: qty 30

## 2018-02-02 MED ORDER — MIDAZOLAM HCL 2 MG/2ML IJ SOLN
1.0000 mg | INTRAMUSCULAR | Status: DC | PRN
  Administered 2018-02-02 (×2): 2 mg via INTRAVENOUS

## 2018-02-02 MED ORDER — BUPIVACAINE-EPINEPHRINE (PF) 0.25% -1:200000 IJ SOLN
INTRAMUSCULAR | Status: AC
  Filled 2018-02-02: qty 30

## 2018-02-02 MED ORDER — FENTANYL CITRATE (PF) 100 MCG/2ML IJ SOLN: 25.0000 ug | INTRAMUSCULAR | Status: DC | PRN

## 2018-02-02 MED ORDER — MEPERIDINE HCL 25 MG/ML IJ SOLN: 6.2500 mg | INTRAMUSCULAR | Status: DC | PRN

## 2018-02-02 MED ORDER — LACTATED RINGERS IV SOLN: INTRAVENOUS | Status: DC

## 2018-02-02 MED ORDER — ROCURONIUM BROMIDE 50 MG/5ML IV SOSY
PREFILLED_SYRINGE | INTRAVENOUS | Status: AC
  Filled 2018-02-02: qty 5

## 2018-02-02 MED ORDER — PROPOFOL 10 MG/ML IV BOLUS
INTRAVENOUS | Status: DC | PRN
  Administered 2018-02-02: 200 mg via INTRAVENOUS

## 2018-02-02 MED ORDER — GLYCOPYRROLATE PF 0.2 MG/ML IJ SOSY
PREFILLED_SYRINGE | INTRAMUSCULAR | Status: DC | PRN
  Administered 2018-02-02: .2 mg via INTRAVENOUS

## 2018-02-02 MED ORDER — PROPOFOL 10 MG/ML IV BOLUS
INTRAVENOUS | Status: AC
  Filled 2018-02-02: qty 20

## 2018-02-02 MED ORDER — GABAPENTIN 300 MG PO CAPS
ORAL_CAPSULE | ORAL | Status: AC
  Filled 2018-02-02: qty 1

## 2018-02-02 MED ORDER — ONDANSETRON HCL 4 MG/2ML IJ SOLN
INTRAMUSCULAR | Status: AC
  Filled 2018-02-02: qty 2

## 2018-02-02 MED ORDER — CEFAZOLIN SODIUM-DEXTROSE 2-4 GM/100ML-% IV SOLN
INTRAVENOUS | Status: AC
  Filled 2018-02-02: qty 100

## 2018-02-02 MED ORDER — CELECOXIB 200 MG PO CAPS
ORAL_CAPSULE | ORAL | Status: AC
  Filled 2018-02-02: qty 1

## 2018-02-02 MED ORDER — LIDOCAINE HCL (CARDIAC) PF 100 MG/5ML IV SOSY
PREFILLED_SYRINGE | INTRAVENOUS | Status: DC | PRN
  Administered 2018-02-02: 100 mg via INTRAVENOUS

## 2018-02-02 MED ORDER — ACETAMINOPHEN 500 MG PO TABS
1000.0000 mg | ORAL_TABLET | ORAL | Status: AC
  Administered 2018-02-02: 1000 mg via ORAL

## 2018-02-02 MED ORDER — SCOPOLAMINE 1 MG/3DAYS TD PT72: 1.0000 | MEDICATED_PATCH | Freq: Once | TRANSDERMAL | Status: DC | PRN

## 2018-02-02 MED ORDER — CEFAZOLIN SODIUM-DEXTROSE 2-4 GM/100ML-% IV SOLN
2.0000 g | INTRAVENOUS | Status: AC
  Administered 2018-02-02: 2 g via INTRAVENOUS

## 2018-02-02 MED ORDER — CELECOXIB 200 MG PO CAPS
200.0000 mg | ORAL_CAPSULE | ORAL | Status: AC
  Administered 2018-02-02: 200 mg via ORAL

## 2018-02-02 MED ORDER — SODIUM CHLORIDE (PF) 0.9 % IJ SOLN
INTRAMUSCULAR | Status: AC
  Filled 2018-02-02: qty 10

## 2018-02-02 MED ORDER — OXYCODONE HCL 5 MG/5ML PO SOLN: 5.0000 mg | Freq: Once | ORAL | Status: DC | PRN

## 2018-02-02 MED ORDER — ONDANSETRON HCL 4 MG/2ML IJ SOLN
INTRAMUSCULAR | Status: DC | PRN
  Administered 2018-02-02: 4 mg via INTRAVENOUS

## 2018-02-02 SURGICAL SUPPLY — 60 items
ADH SKN CLS APL DERMABOND .7 (GAUZE/BANDAGES/DRESSINGS)
APL SKNCLS STERI-STRIP NONHPOA (GAUZE/BANDAGES/DRESSINGS)
BENZOIN TINCTURE PRP APPL 2/3 (GAUZE/BANDAGES/DRESSINGS) IMPLANT
BLADE CLIPPER SURG (BLADE) IMPLANT
BLADE HEX COATED 2.75 (ELECTRODE) ×3 IMPLANT
BLADE SURG 10 STRL SS (BLADE) ×3 IMPLANT
CANISTER SUCT 1200ML W/VALVE (MISCELLANEOUS) ×3 IMPLANT
CHLORAPREP W/TINT 26ML (MISCELLANEOUS) ×3 IMPLANT
COVER BACK TABLE 60X90IN (DRAPES) ×6 IMPLANT
COVER MAYO STAND STRL (DRAPES) ×3 IMPLANT
COVER WAND RF STERILE (DRAPES) IMPLANT
DECANTER SPIKE VIAL GLASS SM (MISCELLANEOUS) IMPLANT
DERMABOND ADVANCED (GAUZE/BANDAGES/DRESSINGS)
DERMABOND ADVANCED .7 DNX12 (GAUZE/BANDAGES/DRESSINGS) IMPLANT
DRAIN PENROSE 1/2X12 LTX STRL (WOUND CARE) ×3 IMPLANT
DRAPE LAPAROTOMY 100X72 PEDS (DRAPES) ×3 IMPLANT
DRAPE LAPAROTOMY TRNSV 102X78 (DRAPE) IMPLANT
DRAPE UTILITY XL STRL (DRAPES) ×3 IMPLANT
ELECT REM PT RETURN 9FT ADLT (ELECTROSURGICAL) ×2
ELECTRODE REM PT RTRN 9FT ADLT (ELECTROSURGICAL) ×2 IMPLANT
GAUZE SPONGE 4X4 12PLY STRL LF (GAUZE/BANDAGES/DRESSINGS) ×1 IMPLANT
GLOVE EUDERMIC 7 POWDERFREE (GLOVE) ×3 IMPLANT
GOWN STRL REUS W/ TWL LRG LVL3 (GOWN DISPOSABLE) ×2 IMPLANT
GOWN STRL REUS W/ TWL XL LVL3 (GOWN DISPOSABLE) ×2 IMPLANT
GOWN STRL REUS W/TWL LRG LVL3 (GOWN DISPOSABLE) ×2
GOWN STRL REUS W/TWL XL LVL3 (GOWN DISPOSABLE) ×2
MESH ULTRAPRO 3X6 7.6X15CM (Mesh General) ×1 IMPLANT
NDL HYPO 25X1 1.5 SAFETY (NEEDLE) ×2 IMPLANT
NEEDLE HYPO 22GX1.5 SAFETY (NEEDLE) ×3 IMPLANT
NEEDLE HYPO 25X1 1.5 SAFETY (NEEDLE) ×2 IMPLANT
NS IRRIG 1000ML POUR BTL (IV SOLUTION) ×3 IMPLANT
PACK BASIN DAY SURGERY FS (CUSTOM PROCEDURE TRAY) ×3 IMPLANT
PENCIL BUTTON HOLSTER BLD 10FT (ELECTRODE) ×3 IMPLANT
SLEEVE SCD COMPRESS KNEE MED (MISCELLANEOUS) ×1 IMPLANT
SPONGE LAP 4X18 RFD (DISPOSABLE) ×1 IMPLANT
STAPLER VISISTAT 35W (STAPLE) IMPLANT
STRIP CLOSURE SKIN 1/2X4 (GAUZE/BANDAGES/DRESSINGS) IMPLANT
SUT MNCRL AB 4-0 PS2 18 (SUTURE) ×3 IMPLANT
SUT PROLENE 1 CT (SUTURE) IMPLANT
SUT PROLENE 2 0 CT2 30 (SUTURE) ×6 IMPLANT
SUT SILK 2 0 SH (SUTURE) IMPLANT
SUT SILK 2 0 TIES 17X18 (SUTURE) ×2
SUT SILK 2-0 18XBRD TIE BLK (SUTURE) ×2 IMPLANT
SUT SILK 3 0 SH 30 (SUTURE) IMPLANT
SUT VIC AB 2-0 CT1 27 (SUTURE)
SUT VIC AB 2-0 CT1 TAPERPNT 27 (SUTURE) IMPLANT
SUT VIC AB 2-0 SH 27 (SUTURE) ×4
SUT VIC AB 2-0 SH 27XBRD (SUTURE) ×2 IMPLANT
SUT VIC AB 3-0 54X BRD REEL (SUTURE) IMPLANT
SUT VIC AB 3-0 BRD 54 (SUTURE) ×2
SUT VIC AB 3-0 FS2 27 (SUTURE) IMPLANT
SUT VIC AB 3-0 SH 27 (SUTURE) ×2
SUT VIC AB 3-0 SH 27X BRD (SUTURE) ×2 IMPLANT
SUT VICRYL 3-0 CR8 SH (SUTURE) IMPLANT
SYR 10ML LL (SYRINGE) ×3 IMPLANT
SYR 20CC LL (SYRINGE) ×3 IMPLANT
SYR BULB 3OZ (MISCELLANEOUS) ×3 IMPLANT
TRAY DSU PREP LF (CUSTOM PROCEDURE TRAY) ×3 IMPLANT
TUBE CONNECTING 20X1/4 (TUBING) ×3 IMPLANT
YANKAUER SUCT BULB TIP NO VENT (SUCTIONS) ×3 IMPLANT

## 2018-02-02 NOTE — Op Note (Signed)
Patient Name:           Brent Wright   Date of Surgery:        02/02/2018  Pre op Diagnosis:      Left inguinal hernia  Post op Diagnosis:    Indirect left inguinal hernia  Procedure:                 Open Wright left inguinal hernia with meshKarl Pock Wright)  Surgeon:                     Edsel Petrin. Dalbert Batman, M.D., FACS  Assistant:                      OR staff  Operative Indications:   [name], generally healthy 68 year old man, referred by Dr. Alain Marion for evaluation of symptomatic left inguinal hernia.       He is an immigrant from [place] has been here for many years. 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 About 3 months ago he noticed a bulge in his left groin. He cannot push this all the way back in. It is a little uncomfortable. No other complications or problems.     He wants to have the hernia repaired now. That is reasonable considering the onset of symptoms and the fact that it is only partially reducible He'll be scheduled for open Wright of left inguinal hernia with mesh.   Operative Findings:       [name] in the operating room.  I found that he had an indirect left inguinal hernia with a large indirect sac.  The sac was empty without any incarcerated contents.  Lichtenstein Wright was performed  Procedure in Detail:          Following the induction of general endotracheal anesthesia the patient's lower abdomen and genitalia and groins were prepped and draped in a sterile fashion.  Intravenous antibiotics were given.  Surgical timeout was performed.  TA PP block had been performed in the holding area.  I supplemented this with 0.25% Marcaine with epinephrine as a local infiltration anesthetic     A transverse incision was made in the left groin overlying the left inguinal canal.  Dissection was carried down through the subcutaneous tissue exposing the aponeurosis of the external oblique.  The  external oblique was incised in the direction of its fibers, opening of the external inguinal ring.  The external oblique was dissected away from the underlying tissues and self-retaining retractors were placed.  A small sensory nerve laterally was clamped divided and ligated with a 2-0 silk tie.  The cord structures were mobilized and encircled with a Penrose drain.  A large lipoma of the cord was dissected away from the cord back to the level of the internal ring, clamped, amputated and ligated with 3-0 Vicryl tie.  Cremasteric muscles were skeletonized.  A large indirect sac was dissected away from the cord structures all the way back to the internal ring.  The sac was opened and inspected and found to be empty.  The sac was twisted and suture ligated at the level of the internal ring with a 2-0 Vicryl suture ligature.  The the redundant sac was amputated and discarded. The floor of the inguinal canal was repaired and reinforced with a 3 x 6 inch piece of ultra pro mesh.  The mesh was trimmed at  the corners to accommodate the anatomy of the wound and sutured in place with 2-0 Prolene.  The mesh was sutured so as to generously overlap the fascia at the pubic tubercle, then along the inguinal ligament inferiorly.  Mattress sutures were placed medially, superiorly, and superior laterally.  The mesh was incised laterally so as to wrap around the cord structures at the internal ring.  The tails of the mesh were overlapped laterally and further Prolene sutures placed laterally.  This provided very secure coverage and Wright but allowed an adequate fingertip opening for the cord structures.  The wound was irrigated.  Hemostasis was excellent.  The external oblique was closed with a running suture of 2-0 Vicryl placing the cord structures deep to the external oblique.  Scarpa's fascia was closed with 3-0 Vicryl sutures and the skin closed with a running subcuticular 4-0 Monocryl and Dermabond.  The patient tolerated the  procedure well and was taken to PACU in stable condition.  EBL 10 cc.  Counts correct.  Complications none.    Addendum: I logged onto the PMP aware website and reviewed his prescription medication history    Takao Lizer M. Dalbert Batman, M.D., FACS General and Minimally Invasive Surgery Breast and Colorectal Surgery  02/02/2018 8:52 AM

## 2018-02-02 NOTE — Anesthesia Procedure Notes (Signed)
Anesthesia Regional Block: TAP block   Pre-Anesthetic Checklist: ,, timeout performed, Correct Patient, Correct Site, Correct Laterality, Correct Procedure, Correct Position, site marked, Risks and benefits discussed,  Surgical consent,  Pre-op evaluation,  At surgeon's request and post-op pain management  Laterality: Left  Prep: chloraprep       Needles:  Injection technique: Single-shot  Needle Type: Echogenic Stimulator Needle     Needle Length: 9cm  Needle Gauge: 21     Additional Needles:   Procedures:,,,, ultrasound used (permanent image in chart),,,,  Narrative:  Start time: 02/02/2018 7:20 AM End time: 02/02/2018 7:25 AM Injection made incrementally with aspirations every 5 mL.  Performed by: Personally  Anesthesiologist: Effie Berkshire, MD  Additional Notes: Patient tolerated the procedure well. Local anesthetic introduced in an incremental fashion under minimal resistance after negative aspirations. No paresthesias were elicited. After completion of the procedure, no acute issues were identified and patient continued to be monitored by RN.

## 2018-02-02 NOTE — Anesthesia Procedure Notes (Signed)
Procedure Name: Intubation Date/Time: 02/02/2018 7:43 AM Performed by: Lyndee Leo, CRNA Pre-anesthesia Checklist: Patient identified, Emergency Drugs available, Suction available and Patient being monitored Patient Re-evaluated:Patient Re-evaluated prior to induction Oxygen Delivery Method: Circle system utilized Preoxygenation: Pre-oxygenation with 100% oxygen Induction Type: IV induction Ventilation: Mask ventilation without difficulty Laryngoscope Size: Miller and 3 Grade View: Grade II Tube type: Oral Tube size: 8.0 mm Number of attempts: 1 Airway Equipment and Method: Stylet and Oral airway Placement Confirmation: ETT inserted through vocal cords under direct vision,  positive ETCO2 and breath sounds checked- equal and bilateral Secured at: 22 cm Tube secured with: Tape Dental Injury: Teeth and Oropharynx as per pre-operative assessment

## 2018-02-02 NOTE — Transfer of Care (Signed)
Immediate Anesthesia Transfer of Care Note  Patient: Brent Wright  Procedure(s) Performed: OPEN REPAIR OF LEFT INGUINAL HERNIA WITH MESH (Left Abdomen) INSERTION OF MESH (Left Abdomen)  Patient Location: PACU  Anesthesia Type:GA combined with regional for post-op pain  Level of Consciousness: sedated and responds to stimulation  Airway & Oxygen Therapy: Patient Spontanous Breathing and Patient connected to face mask oxygen  Post-op Assessment: Report given to RN and Post -op Vital signs reviewed and stable  Post vital signs: Reviewed and stable  Last Vitals:  Vitals Value Taken Time  BP 161/93 02/02/2018  9:03 AM  Temp    Pulse 88 02/02/2018  9:05 AM  Resp 16 02/02/2018  9:05 AM  SpO2 100 % 02/02/2018  9:05 AM  Vitals shown include unvalidated device data.  Last Pain:  Vitals:   02/02/18 0657  TempSrc: Oral  PainSc: 0-No pain         Complications: No apparent anesthesia complications

## 2018-02-02 NOTE — Interval H&P Note (Signed)
History and Physical Interval Note:  02/02/2018 7:04 AM  Brent Wright  has presented today for surgery, with the diagnosis of left inguinal hernia  The various methods of treatment have been discussed with the patient and family. After consideration of risks, benefits and other options for treatment, the patient has consented to  Procedure(s): OPEN REPAIR OF LEFT INGUINAL HERNIA WITH MESH (Left) INSERTION OF MESH (Left) as a surgical intervention .  The patient's history has been reviewed, patient examined, no change in status, stable for surgery.  I have reviewed the patient's chart and labs.  Questions were answered to the patient's satisfaction.     Adin Hector

## 2018-02-02 NOTE — Discharge Instructions (Signed)
[facility] _______[facility]   INGUINAL HERNIA REPAIR: POST OP INSTRUCTIONS  Always review your discharge instruction sheet given to you by the facility where your surgery was performed. IF YOU HAVE DISABILITY OR FAMILY LEAVE FORMS, YOU MUST BRING THEM TO THE OFFICE FOR PROCESSING.   DO NOT GIVE THEM TO YOUR DOCTOR.  1. A  prescription for pain medication may be given to you upon discharge.  Take your pain medication as prescribed, if needed.  If narcotic pain medicine is not needed, then you may take acetaminophen (Tylenol) or ibuprofen (Advil) as needed. 2. Take your usually prescribed medications unless otherwise directed. If you need a refill on your pain medication, please contact your pharmacy.  They will contact our office to request authorization. Prescriptions will not be filled after 5 pm or on week-ends. 3. You should follow a light diet the first 24 hours after arrival home, such as soup and crackers, etc.  Be sure to include lots of fluids daily.  Resume your normal diet the day after surgery. 4.Most patients will experience some swelling and bruising in the groin and scrotum.  Ice packs and reclining will help.  Swelling and bruising can take several days to resolve.  6. It is common to experience some constipation if taking pain medication after surgery.  Increasing fluid intake and taking a stool softener (such as Colace) will usually help or prevent this problem from occurring.  A mild laxative (Milk of Magnesia or Miralax) should be taken according to package directions if there are no bowel movements after 48 hours. 7. Unless discharge instructions indicate otherwise, you may remove your bandages 24-48 hours after surgery, and you may shower at that time.  You may have steri-strips (small skin tapes) in place directly over the incision.  These strips should be left on the skin for 7-10 days.  If your surgeon used skin glue on the incision, you may shower in 24 hours.  The  glue will flake off over the next 2-3 weeks.  Any sutures or staples will be removed at the office during your follow-up visit. 8. ACTIVITIES:  You may resume regular (light) daily activities beginning the next day--such as daily self-care, walking, climbing stairs--gradually increasing activities as tolerated.  You may have sexual intercourse when it is comfortable.  Refrain from any heavy lifting or straining until approved by your doctor.  a.You may drive when you are no longer taking prescription pain medication, you can comfortably wear a seatbelt, and you can safely maneuver your car and apply brakes. b.RETURN TO WORK:   _____________________________________________  9.You should see your doctor in the office for a follow-up appointment approximately 2-3 weeks after your surgery.  Make sure that you call for this appointment within a day or two after you arrive home to insure a convenient appointment time. 10.OTHER INSTRUCTIONS: _________________________    _____________________________________  WHEN TO CALL YOUR DOCTOR: 1. Fever over 101.0 2. Inability to urinate 3. Nausea and/or vomiting 4. Extreme swelling or bruising 5. Continued bleeding from incision. 6. Increased pain, redness, or drainage from the incision  The clinic staff is available to answer your questions during regular business hours.  Please dont hesitate to call and ask to speak to one of the nurses for clinical concerns.  If you have a medical emergency, go to the nearest emergency room or call 911.  A surgeon from [facility] is always on call at the hospital   [address], [employer]  [address], [place], [place]  [address] ?  [address], [place], [place]   [address] [phone] ? [phone] ? FAX [phone] Web site: [email or web address]    Post Anesthesia Home Care Instructions  Activity: Get plenty of rest for the remainder of the day. A responsible individual must stay with you  for 24 hours following the procedure.  For the next 24 hours, DO NOT: -Drive a car -Paediatric nurse -Drink alcoholic beverages -Take any medication unless instructed by your physician -Make any legal decisions or sign important papers.  Meals: Start with liquid foods such as gelatin or soup. Progress to regular foods as tolerated. Avoid greasy, spicy, heavy foods. If nausea and/or vomiting occur, drink only clear liquids until the nausea and/or vomiting subsides. Call your physician if vomiting continues.  Special Instructions/Symptoms: Your throat may feel dry or sore from the anesthesia or the breathing tube placed in your throat during surgery. If this causes discomfort, gargle with warm salt water. The discomfort should disappear within 24 hours.  If you had a scopolamine patch placed behind your ear for the management of post- operative nausea and/or vomiting:  1. The medication in the patch is effective for 72 hours, after which it should be removed.  Wrap patch in a tissue and discard in the trash. Wash hands thoroughly with soap and water. 2. You may remove the patch earlier than 72 hours if you experience unpleasant side effects which may include dry mouth, dizziness or visual disturbances. 3. Avoid touching the patch. Wash your hands with soap and water after contact with the patch.

## 2018-02-02 NOTE — Progress Notes (Signed)
Assisted Dr. Hollis with left, ultrasound guided, transabdominal plane block. Side rails up, monitors on throughout procedure. See vital signs in flow sheet. Tolerated Procedure well. 

## 2018-02-02 NOTE — Anesthesia Postprocedure Evaluation (Signed)
Anesthesia Post Note  Patient: Brent Wright  Procedure(s) Performed: OPEN REPAIR OF LEFT INGUINAL HERNIA WITH MESH (Left Abdomen) INSERTION OF MESH (Left Abdomen)     Patient location during evaluation: PACU Anesthesia Type: General Level of consciousness: awake and alert Pain management: pain level controlled Vital Signs Assessment: post-procedure vital signs reviewed and stable Respiratory status: spontaneous breathing, nonlabored ventilation, respiratory function stable and patient connected to nasal cannula oxygen Cardiovascular status: blood pressure returned to baseline and stable Postop Assessment: no apparent nausea or vomiting Anesthetic complications: no    Last Vitals:  Vitals:   02/02/18 0930 02/02/18 0945  BP: (!) 166/95 (!) 172/97  Pulse: 85 78  Resp: 14 12  Temp:    SpO2: 100% 98%    Last Pain:  Vitals:   02/02/18 0930  TempSrc:   PainSc: 0-No pain                 Effie Berkshire

## 2018-02-05 ENCOUNTER — Encounter (HOSPITAL_BASED_OUTPATIENT_CLINIC_OR_DEPARTMENT_OTHER): Payer: Self-pay | Admitting: General Surgery

## 2018-04-05 DIAGNOSIS — H40023 Open angle with borderline findings, high risk, bilateral: Secondary | ICD-10-CM | POA: Diagnosis not present

## 2018-04-05 DIAGNOSIS — H40053 Ocular hypertension, bilateral: Secondary | ICD-10-CM | POA: Diagnosis not present

## 2018-04-12 ENCOUNTER — Other Ambulatory Visit: Payer: Self-pay | Admitting: Internal Medicine

## 2018-04-12 MED ORDER — CICLOPIROX 8 % EX SOLN: Freq: Every day | CUTANEOUS | 0 refills | Status: DC

## 2018-06-06 ENCOUNTER — Other Ambulatory Visit: Payer: Self-pay

## 2018-06-06 NOTE — Patient Outreach (Signed)
[facility]) Care Management  06/06/2018  Brent Wright 1951/01/08 [id number]    Patient screened for HTA Health Risk Assessment.  Requested Advance Directive documentation to be mailed. Advance Directive packet and EMMI mailed today. Will follow up within the next month to ensure receipt.   Ronn Melena, BSW Social Worker [phone]

## 2018-06-20 ENCOUNTER — Ambulatory Visit (INDEPENDENT_AMBULATORY_CARE_PROVIDER_SITE_OTHER): Payer: PPO | Admitting: Internal Medicine

## 2018-06-20 ENCOUNTER — Other Ambulatory Visit (INDEPENDENT_AMBULATORY_CARE_PROVIDER_SITE_OTHER): Payer: PPO

## 2018-06-20 ENCOUNTER — Encounter: Payer: Self-pay | Admitting: Internal Medicine

## 2018-06-20 ENCOUNTER — Other Ambulatory Visit: Payer: Self-pay

## 2018-06-20 VITALS — BP 152/94 | HR 71 | Temp 98.0°F | Ht 69.0 in | Wt 199.0 lb

## 2018-06-20 DIAGNOSIS — I10 Essential (primary) hypertension: Secondary | ICD-10-CM

## 2018-06-20 DIAGNOSIS — Z0000 Encounter for general adult medical examination without abnormal findings: Secondary | ICD-10-CM

## 2018-06-20 DIAGNOSIS — R079 Chest pain, unspecified: Secondary | ICD-10-CM

## 2018-06-20 DIAGNOSIS — R0789 Other chest pain: Secondary | ICD-10-CM

## 2018-06-20 DIAGNOSIS — K4090 Unilateral inguinal hernia, without obstruction or gangrene, not specified as recurrent: Secondary | ICD-10-CM

## 2018-06-20 DIAGNOSIS — Z1211 Encounter for screening for malignant neoplasm of colon: Secondary | ICD-10-CM

## 2018-06-20 LAB — CBC WITH DIFFERENTIAL/PLATELET
Basophils Absolute: 0 10*3/uL (ref 0.0–0.1)
Basophils Relative: 0.5 % (ref 0.0–3.0)
Eosinophils Absolute: 0.3 10*3/uL (ref 0.0–0.7)
Eosinophils Relative: 4.2 % (ref 0.0–5.0)
HCT: 47.2 % (ref 39.0–52.0)
Hemoglobin: 16.1 g/dL (ref 13.0–17.0)
Lymphocytes Relative: 32.8 % (ref 12.0–46.0)
Lymphs Abs: 2.2 10*3/uL (ref 0.7–4.0)
MCHC: 34.1 g/dL (ref 30.0–36.0)
MCV: 88.5 fl (ref 78.0–100.0)
Monocytes Absolute: 0.5 10*3/uL (ref 0.1–1.0)
Monocytes Relative: 8.2 % (ref 3.0–12.0)
Neutro Abs: 3.6 10*3/uL (ref 1.4–7.7)
Neutrophils Relative %: 54.3 % (ref 43.0–77.0)
Platelets: 211 10*3/uL (ref 150.0–400.0)
RBC: 5.33 Mil/uL (ref 4.22–5.81)
RDW: 13.4 % (ref 11.5–15.5)
WBC: 6.6 10*3/uL (ref 4.0–10.5)

## 2018-06-20 LAB — HEPATIC FUNCTION PANEL
ALT: 24 U/L (ref 0–53)
AST: 24 U/L (ref 0–37)
Albumin: 4.3 g/dL (ref 3.5–5.2)
Alkaline Phosphatase: 52 U/L (ref 39–117)
Bilirubin, Direct: 0.1 mg/dL (ref 0.0–0.3)
Total Bilirubin: 0.7 mg/dL (ref 0.2–1.2)
Total Protein: 7.5 g/dL (ref 6.0–8.3)

## 2018-06-20 LAB — LIPID PANEL
Cholesterol: 209 mg/dL — ABNORMAL HIGH (ref 0–200)
HDL: 48.8 mg/dL (ref >39.00)
LDL Cholesterol: 139 mg/dL — ABNORMAL HIGH (ref 0–99)
NonHDL: 160.27
Total CHOL/HDL Ratio: 4
Triglycerides: 104 mg/dL (ref 0.0–149.0)
VLDL: 20.8 mg/dL (ref 0.0–40.0)

## 2018-06-20 LAB — BASIC METABOLIC PANEL
BUN: 18 mg/dL (ref 6–23)
CO2: 27 mEq/L (ref 19–32)
Calcium: 9.3 mg/dL (ref 8.4–10.5)
Chloride: 104 mEq/L (ref 96–112)
Creatinine, Ser: 1.1 mg/dL (ref 0.40–1.50)
GFR: 66.73 mL/min (ref >60.00)
Glucose, Bld: 116 mg/dL — ABNORMAL HIGH (ref 70–99)
Potassium: 4.5 mEq/L (ref 3.5–5.1)
Sodium: 139 mEq/L (ref 135–145)

## 2018-06-20 LAB — URINALYSIS
Bilirubin Urine: NEGATIVE
Hgb urine dipstick: NEGATIVE
Ketones, ur: NEGATIVE
Leukocytes,Ua: NEGATIVE
Nitrite: NEGATIVE
Specific Gravity, Urine: 1.02 (ref 1.000–1.030)
Total Protein, Urine: NEGATIVE
Urine Glucose: NEGATIVE
Urobilinogen, UA: 0.2 (ref 0.0–1.0)
pH: 6 (ref 5.0–8.0)

## 2018-06-20 LAB — PSA: PSA: 2.48 ng/mL (ref 0.10–4.00)

## 2018-06-20 LAB — TSH: TSH: 0.91 u[IU]/mL (ref 0.35–4.50)

## 2018-06-20 MED ORDER — LOSARTAN POTASSIUM 100 MG PO TABS: 100.0000 mg | ORAL_TABLET | Freq: Every day | ORAL | 3 refills | Status: DC

## 2018-06-20 MED ORDER — CICLOPIROX 8 % EX SOLN: Freq: Every day | CUTANEOUS | 5 refills | Status: DC

## 2018-06-20 NOTE — Assessment & Plan Note (Signed)
CT ca scoring test offered 6/20

## 2018-06-20 NOTE — Progress Notes (Signed)
Subjective:  Patient ID: Brent Wright, male    DOB: June 23, 1950  Age: 68 y.o. MRN: [id number]  CC: No chief complaint on file.   HPI Arvind Mexicano presents for well exam C/o elevated BP  He had   Outpatient Medications Prior to Visit  Medication Sig Dispense Refill  . Cholecalciferol (VITAMIN D3) 2000 units capsule Take 1 capsule (2,000 Units total) by mouth daily. 100 capsule 3  . ciclopirox (PENLAC) 8 % solution Apply topically at bedtime. Apply over nail and surrounding skin. Apply daily over previous coat 6.6 mL 0  . HYDROcodone-acetaminophen (NORCO) 5-325 MG tablet Take 1-2 tablets by mouth every 6 (six) hours as needed for moderate pain or severe pain. 20 tablet 0   No facility-administered medications prior to visit.     ROS: Review of Systems  Constitutional: Negative for appetite change, fatigue and unexpected weight change.  HENT: Negative for congestion, nosebleeds, sneezing, sore throat and trouble swallowing.   Eyes: Negative for itching and visual disturbance.  Respiratory: Negative for cough.   Cardiovascular: Negative for chest pain, palpitations and leg swelling.  Gastrointestinal: Negative for abdominal distention, blood in stool, diarrhea and nausea.  Genitourinary: Negative for frequency and hematuria.  Musculoskeletal: Negative for back pain, gait problem, joint swelling and neck pain.  Skin: Negative for rash.  Neurological: Negative for dizziness, tremors, speech difficulty and weakness.  Psychiatric/Behavioral: Negative for agitation, dysphoric mood and sleep disturbance. The patient is not nervous/anxious.     Objective:  BP (!) 152/94 (BP Location: Left Arm, Patient Position: Sitting, Cuff Size: Normal)   Pulse 71   Temp 98 F (36.7 C) (Oral)   Ht 5\' 9"  (1.753 m)   Wt 199 lb (90.3 kg)   SpO2 98%   BMI 29.39 kg/m   BP Readings from Last 3 Encounters:  06/20/18 (!) 152/94  02/02/18 (!) 185/95  11/22/17 136/82    Wt Readings from Last 3  Encounters:  06/20/18 199 lb (90.3 kg)  02/02/18 201 lb 11.5 oz (91.5 kg)  11/22/17 194 lb (88 kg)    Physical Exam Constitutional:      General: He is not in acute distress.    Appearance: He is well-developed.     Comments: NAD  Eyes:     Conjunctiva/sclera: Conjunctivae normal.     Pupils: Pupils are equal, round, and reactive to light.  Neck:     Musculoskeletal: Normal range of motion.     Thyroid: No thyromegaly.     Vascular: No JVD.  Cardiovascular:     Rate and Rhythm: Normal rate and regular rhythm.     Heart sounds: Normal heart sounds. No murmur. No friction rub. No gallop.   Pulmonary:     Effort: Pulmonary effort is normal. No respiratory distress.     Breath sounds: Normal breath sounds. No wheezing or rales.  Chest:     Chest wall: No tenderness.  Abdominal:     General: Bowel sounds are normal. There is no distension.     Palpations: Abdomen is soft. There is no mass.     Tenderness: There is no abdominal tenderness. There is no guarding or rebound.  Genitourinary:    Rectum: Normal. Guaiac result negative.  Musculoskeletal: Normal range of motion.        General: No tenderness.  Lymphadenopathy:     Cervical: No cervical adenopathy.  Skin:    General: Skin is warm and dry.     Findings: No rash.  Neurological:  Mental Status: He is alert and oriented to person, place, and time.     Cranial Nerves: No cranial nerve deficit.     Motor: No abnormal muscle tone.     Coordination: Coordination normal.     Gait: Gait normal.     Deep Tendon Reflexes: Reflexes are normal and symmetric.  Psychiatric:        Behavior: Behavior normal.        Thought Content: Thought content normal.        Judgment: Judgment normal.   prostate 1+  Lab Results  Component Value Date   WBC 7.5 05/01/2017   HGB 14.9 05/01/2017   HCT 43.7 05/01/2017   PLT 206.0 05/01/2017   GLUCOSE 114 (H) 05/01/2017   CHOL 181 05/01/2017   TRIG 106.0 05/01/2017   HDL 42.70  05/01/2017   LDLCALC 117 (H) 05/01/2017   ALT 18 05/01/2017   AST 18 05/01/2017   NA 139 05/01/2017   K 4.1 05/01/2017   CL 104 05/01/2017   CREATININE 1.23 05/01/2017   BUN 22 05/01/2017   CO2 23 05/01/2017   TSH 1.37 05/01/2017   PSA 1.87 09/03/2014    No results found.  Assessment & Plan:   There are no diagnoses linked to this encounter.   No orders of the defined types were placed in this encounter.    Follow-up: No follow-ups on file.  Walker Kehr, MD

## 2018-06-20 NOTE — Patient Instructions (Signed)

## 2018-06-20 NOTE — Assessment & Plan Note (Signed)
CT ca scoring test offered 6/20 Losartan

## 2018-06-20 NOTE — Assessment & Plan Note (Signed)
s/p surgery L

## 2018-07-03 ENCOUNTER — Encounter: Payer: Self-pay | Admitting: Gastroenterology

## 2018-07-13 ENCOUNTER — Other Ambulatory Visit: Payer: Self-pay

## 2018-07-13 ENCOUNTER — Ambulatory Visit (AMBULATORY_SURGERY_CENTER): Payer: Self-pay | Admitting: *Deleted

## 2018-07-13 VITALS — Ht 69.0 in | Wt 199.2 lb

## 2018-07-13 DIAGNOSIS — Z1211 Encounter for screening for malignant neoplasm of colon: Secondary | ICD-10-CM

## 2018-07-13 MED ORDER — PEG 3350-KCL-NA BICARB-NACL 420 G PO SOLR: 4000.0000 mL | Freq: Once | ORAL | 0 refills | Status: AC

## 2018-07-13 NOTE — Progress Notes (Signed)
No egg or soy allergy known to patient  No issues with past sedation with any surgeries  or procedures, no intubation problems  No diet pills per patient No home 02 use per patient  No blood thinners per patient  Pt denies issues with constipation  No A fib or A flutter  EMMI video sent to pt's e mail  Pt. Was able to understand nurse. Pt. Pre visit completed in person,pt. Allowed time to look over all information and ask questions,verbalize understanding.number provided and encouraged to call if have questions

## 2018-07-17 ENCOUNTER — Other Ambulatory Visit: Payer: Self-pay

## 2018-07-17 NOTE — Patient Outreach (Signed)
[facility]) Care Management  07/17/2018  Keita Valley May 03, 1950 [id number]   [facility] BSW closing case due to patient receiving other CM services.   Ronn Melena, BSW Social Worker [phone]

## 2018-07-27 ENCOUNTER — Ambulatory Visit (AMBULATORY_SURGERY_CENTER): Payer: PPO | Admitting: Gastroenterology

## 2018-07-27 ENCOUNTER — Encounter: Payer: Self-pay | Admitting: Gastroenterology

## 2018-07-27 ENCOUNTER — Other Ambulatory Visit: Payer: Self-pay

## 2018-07-27 VITALS — BP 147/75 | HR 52 | Temp 98.2°F | Resp 16 | Ht 69.0 in | Wt 199.0 lb

## 2018-07-27 DIAGNOSIS — D128 Benign neoplasm of rectum: Secondary | ICD-10-CM | POA: Diagnosis not present

## 2018-07-27 DIAGNOSIS — Z1211 Encounter for screening for malignant neoplasm of colon: Secondary | ICD-10-CM

## 2018-07-27 DIAGNOSIS — D122 Benign neoplasm of ascending colon: Secondary | ICD-10-CM

## 2018-07-27 DIAGNOSIS — D127 Benign neoplasm of rectosigmoid junction: Secondary | ICD-10-CM

## 2018-07-27 DIAGNOSIS — K5520 Angiodysplasia of colon without hemorrhage: Secondary | ICD-10-CM | POA: Diagnosis not present

## 2018-07-27 MED ORDER — SODIUM CHLORIDE 0.9 % IV SOLN: 500.0000 mL | Freq: Once | INTRAVENOUS | Status: DC

## 2018-07-27 NOTE — Progress Notes (Signed)
Pt's states no medical or surgical changes since previsit or office visit.  [facility] CMA vitals and Riki Sheer LPN temps.

## 2018-07-27 NOTE — Patient Instructions (Signed)
Handouts given for high fiber diet, hemorrhoids and polyps.  YOU HAD AN ENDOSCOPIC PROCEDURE TODAY AT [facility]:   Refer to the procedure report that was given to you for any specific questions about what was found during the examination.  If the procedure report does not answer your questions, please call your gastroenterologist to clarify.  If you requested that your care partner not be given the details of your procedure findings, then the procedure report has been included in a sealed envelope for you to review at your convenience later.  YOU SHOULD EXPECT: Some feelings of bloating in the abdomen. Passage of more gas than usual.  Walking can help get rid of the air that was put into your GI tract during the procedure and reduce the bloating. If you had a lower endoscopy (such as a colonoscopy or flexible sigmoidoscopy) you may notice spotting of blood in your stool or on the toilet paper. If you underwent a bowel prep for your procedure, you may not have a normal bowel movement for a few days.  Please Note:  You might notice some irritation and congestion in your nose or some drainage.  This is from the oxygen used during your procedure.  There is no need for concern and it should clear up in a day or so.  SYMPTOMS TO REPORT IMMEDIATELY:   Following lower endoscopy (colonoscopy or flexible sigmoidoscopy):  Excessive amounts of blood in the stool  Significant tenderness or worsening of abdominal pains  Swelling of the abdomen that is new, acute  Fever of 100F or higher  For urgent or emergent issues, a gastroenterologist can be reached at any hour by calling [phone].   DIET:  We do recommend a small meal at first, but then you may proceed to your regular diet.  Drink plenty of fluids but you should avoid alcoholic beverages for 24 hours.  ACTIVITY:  You should plan to take it easy for the rest of today and you should NOT DRIVE or use heavy machinery until tomorrow  (because of the sedation medicines used during the test).    FOLLOW UP: Our staff will call the number listed on your records 48-72 hours following your procedure to check on you and address any questions or concerns that you may have regarding the information given to you following your procedure. If we do not reach you, we will leave a message.  We will attempt to reach you two times.  During this call, we will ask if you have developed any symptoms of COVID 19. If you develop any symptoms (ie: fever, flu-like symptoms, shortness of breath, cough etc.) before then, please call [phone].  If you test positive for Covid 19 in the 2 weeks post procedure, please call and report this information to [place].    If any biopsies were taken you will be contacted by phone or by letter within the next 1-3 weeks.  Please call us at [phone] if you have not heard about the biopsies in 3 weeks.    SIGNATURES/CONFIDENTIALITY: You and/or your care partner have signed paperwork which will be entered into your electronic medical record.  These signatures attest to the fact that that the information above on your After Visit Summary has been reviewed and is understood.  Full responsibility of the confidentiality of this discharge information lies with you and/or your care-partner.

## 2018-07-27 NOTE — Progress Notes (Signed)
Called to room to assist during endoscopic procedure.  Patient ID and intended procedure confirmed with present staff. Received instructions for my participation in the procedure from the performing physician.  

## 2018-07-27 NOTE — Progress Notes (Signed)
To PACU, VSS. Report to [email or web address] 

## 2018-07-27 NOTE — Op Note (Signed)
[place] Patient Name: Brent Wright Procedure Date: 07/27/2018 11:00 AM MRN: [id number] Endoscopist: Justice Britain , MD Age: 68 Referring MD:  Date of Birth: June 29, 1950 Gender: Male Account #: [id number] Procedure:                Colonoscopy Indications:              Screening for colorectal malignant neoplasm Medicines:                Monitored Anesthesia Care Procedure:                Pre-Anesthesia Assessment:                           - Prior to the procedure, a History and Physical                            was performed, and patient medications and                            allergies were reviewed. The patient's tolerance of                            previous anesthesia was also reviewed. The risks                            and benefits of the procedure and the sedation                            options and risks were discussed with the patient.                            All questions were answered, and informed consent                            was obtained. Prior Anticoagulants: The patient has                            taken no previous anticoagulant or antiplatelet                            agents. ASA Grade Assessment: II - A patient with                            mild systemic disease. After reviewing the risks                            and benefits, the patient was deemed in                            satisfactory condition to undergo the procedure.                           After obtaining informed consent, the colonoscope  was passed under direct vision. Throughout the                            procedure, the patient's blood pressure, pulse, and                            oxygen saturations were monitored continuously. The                            Colonoscope was introduced through the anus and                            advanced to the 5 cm into the ileum. The                            colonoscopy was performed  without difficulty. The                            patient tolerated the procedure. The quality of the                            bowel preparation was excellent. The terminal                            ileum, ileocecal valve, appendiceal orifice, and                            rectum were photographed. Scope In: 11:19:06 AM Scope Out: 11:40:44 AM Scope Withdrawal Time: 0 hours 18 minutes 18 seconds  Total Procedure Duration: 0 hours 21 minutes 38 seconds  Findings:                 The digital rectal exam findings include                            hemorrhoids. Pertinent negatives include no                            palpable rectal lesions.                           The terminal ileum and ileocecal valve appeared                            normal.                           A single medium-sized, non-bleeding, angioectasia                            with typical arborization was found in the cecum.                           Five sessile polyps were found in the rectum (1),  recto-sigmoid colon (2) and ascending colon (2).                            The polyps were 2 to 6 mm in size. These polyps                            were removed with a cold snare. Resection and                            retrieval were complete.                           Normal mucosa was found in the entire colon                            otherwise.                           Non-bleeding non-thrombosed internal hemorrhoids                            were found during retroflexion, during perianal                            exam and during digital exam. The hemorrhoids were                            Grade II (internal hemorrhoids that prolapse but                            reduce spontaneously). Complications:            No immediate complications. Estimated Blood Loss:     Estimated blood loss was minimal. Impression:               - Hemorrhoids found on digital rectal exam.                            - The examined portion of the ileum was normal.                           - A single non-bleeding colonic angioectasia.                           - Five 2 to 6 mm polyps in the rectum, at the                            recto-sigmoid colon and in the ascending colon,                            removed with a cold snare. Resected and retrieved.                           - Normal mucosa in the entire examined colon  otherwise.                           - Non-bleeding non-thrombosed internal hemorrhoids. Recommendation:           - The patient will be observed post-procedure,                            until all discharge criteria are met.                           - Discharge patient to home.                           - Patient has a contact number available for                            emergencies. The signs and symptoms of potential                            delayed complications were discussed with the                            patient. Return to normal activities tomorrow.                            Written discharge instructions were provided to the                            patient.                           - High fiber diet.                           - Continue present medications.                           - Use FiberCon 1 tablet PO daily.                           - Await pathology results.                           - Repeat colonoscopy 3/5/7 years for surveillance                            based on pathology results and findings of                            adenomatous v sessile serrated tissue.                           - The findings and recommendations were discussed                            with the patient. Justice Britain, MD 07/27/2018 11:46:59  AM

## 2018-07-31 ENCOUNTER — Telehealth: Payer: Self-pay

## 2018-07-31 ENCOUNTER — Encounter: Payer: Self-pay | Admitting: Gastroenterology

## 2018-07-31 NOTE — Telephone Encounter (Signed)
  Follow up Call-  Call back number 07/27/2018  Post procedure Call Back phone  # [phone]  Permission to leave phone message Yes  Some recent data might be hidden     Patient questions:  Do you have a fever, pain , or abdominal swelling? No. Pain Score  0 *  Have you tolerated food without any problems? Yes.    Have you been able to return to your normal activities? Yes.    Do you have any questions about your discharge instructions: Diet   No. Medications  No. Follow up visit  No.  Do you have questions or concerns about your Care? No.  Actions: * If pain score is 4 or above: No action needed, pain <4.  1. Have you developed a fever since your procedure? no  2.   Have you had an respiratory symptoms (SOB or cough) since your procedure? no  3.   Have you tested positive for COVID 19 since your procedure no  4.   Have you had any family members/close contacts diagnosed with the COVID 19 since your procedure?  no   If yes to any of these questions please route to Joylene John, RN and Alphonsa Gin, Therapist, sports.

## 2018-09-06 DIAGNOSIS — H524 Presbyopia: Secondary | ICD-10-CM | POA: Diagnosis not present

## 2018-09-06 DIAGNOSIS — H25013 Cortical age-related cataract, bilateral: Secondary | ICD-10-CM | POA: Diagnosis not present

## 2018-09-06 DIAGNOSIS — H5213 Myopia, bilateral: Secondary | ICD-10-CM | POA: Diagnosis not present

## 2018-09-06 DIAGNOSIS — H40053 Ocular hypertension, bilateral: Secondary | ICD-10-CM | POA: Diagnosis not present

## 2018-09-06 DIAGNOSIS — H40023 Open angle with borderline findings, high risk, bilateral: Secondary | ICD-10-CM | POA: Diagnosis not present

## 2018-09-06 DIAGNOSIS — H2513 Age-related nuclear cataract, bilateral: Secondary | ICD-10-CM | POA: Diagnosis not present

## 2018-09-06 DIAGNOSIS — D3131 Benign neoplasm of right choroid: Secondary | ICD-10-CM | POA: Diagnosis not present

## 2018-09-06 DIAGNOSIS — H43813 Vitreous degeneration, bilateral: Secondary | ICD-10-CM | POA: Diagnosis not present

## 2018-10-24 ENCOUNTER — Other Ambulatory Visit: Payer: Self-pay

## 2018-10-24 ENCOUNTER — Other Ambulatory Visit (INDEPENDENT_AMBULATORY_CARE_PROVIDER_SITE_OTHER): Payer: PPO

## 2018-10-24 ENCOUNTER — Ambulatory Visit (INDEPENDENT_AMBULATORY_CARE_PROVIDER_SITE_OTHER): Payer: PPO | Admitting: Internal Medicine

## 2018-10-24 ENCOUNTER — Encounter: Payer: Self-pay | Admitting: Internal Medicine

## 2018-10-24 VITALS — BP 134/82 | HR 60 | Temp 98.2°F | Ht 69.0 in | Wt 200.0 lb

## 2018-10-24 DIAGNOSIS — Z23 Encounter for immunization: Secondary | ICD-10-CM | POA: Diagnosis not present

## 2018-10-24 DIAGNOSIS — K219 Gastro-esophageal reflux disease without esophagitis: Secondary | ICD-10-CM

## 2018-10-24 DIAGNOSIS — I10 Essential (primary) hypertension: Secondary | ICD-10-CM | POA: Diagnosis not present

## 2018-10-24 DIAGNOSIS — R0789 Other chest pain: Secondary | ICD-10-CM

## 2018-10-24 DIAGNOSIS — Z7721 Contact with and (suspected) exposure to potentially hazardous body fluids: Secondary | ICD-10-CM

## 2018-10-24 DIAGNOSIS — N529 Male erectile dysfunction, unspecified: Secondary | ICD-10-CM

## 2018-10-24 LAB — CBC WITH DIFFERENTIAL/PLATELET
Basophils Absolute: 0 10*3/uL (ref 0.0–0.1)
Basophils Relative: 0.5 % (ref 0.0–3.0)
Eosinophils Absolute: 0.2 10*3/uL (ref 0.0–0.7)
Eosinophils Relative: 3.5 % (ref 0.0–5.0)
HCT: 44.1 % (ref 39.0–52.0)
Hemoglobin: 15.1 g/dL (ref 13.0–17.0)
Lymphocytes Relative: 35.5 % (ref 12.0–46.0)
Lymphs Abs: 2 10*3/uL (ref 0.7–4.0)
MCHC: 34.3 g/dL (ref 30.0–36.0)
MCV: 89.3 fl (ref 78.0–100.0)
Monocytes Absolute: 0.6 10*3/uL (ref 0.1–1.0)
Monocytes Relative: 10.1 % (ref 3.0–12.0)
Neutro Abs: 2.8 10*3/uL (ref 1.4–7.7)
Neutrophils Relative %: 50.4 % (ref 43.0–77.0)
Platelets: 203 10*3/uL (ref 150.0–400.0)
RBC: 4.93 Mil/uL (ref 4.22–5.81)
RDW: 13 % (ref 11.5–15.5)
WBC: 5.6 10*3/uL (ref 4.0–10.5)

## 2018-10-24 LAB — URINALYSIS
Bilirubin Urine: NEGATIVE
Hgb urine dipstick: NEGATIVE
Ketones, ur: NEGATIVE
Leukocytes,Ua: NEGATIVE
Nitrite: NEGATIVE
Specific Gravity, Urine: 1.015 (ref 1.000–1.030)
Total Protein, Urine: NEGATIVE
Urine Glucose: NEGATIVE
Urobilinogen, UA: 0.2 (ref 0.0–1.0)
pH: 6 (ref 5.0–8.0)

## 2018-10-24 LAB — LIPID PANEL
Cholesterol: 203 mg/dL — ABNORMAL HIGH (ref 0–200)
HDL: 43.4 mg/dL (ref >39.00)
LDL Cholesterol: 139 mg/dL — ABNORMAL HIGH (ref 0–99)
NonHDL: 159.37
Total CHOL/HDL Ratio: 5
Triglycerides: 100 mg/dL (ref 0.0–149.0)
VLDL: 20 mg/dL (ref 0.0–40.0)

## 2018-10-24 LAB — BASIC METABOLIC PANEL
BUN: 17 mg/dL (ref 6–23)
CO2: 27 mEq/L (ref 19–32)
Calcium: 9.8 mg/dL (ref 8.4–10.5)
Chloride: 107 mEq/L (ref 96–112)
Creatinine, Ser: 1.04 mg/dL (ref 0.40–1.50)
GFR: 71.11 mL/min (ref >60.00)
Glucose, Bld: 84 mg/dL (ref 70–99)
Potassium: 4.3 mEq/L (ref 3.5–5.1)
Sodium: 142 mEq/L (ref 135–145)

## 2018-10-24 LAB — HEPATIC FUNCTION PANEL
ALT: 16 U/L (ref 0–53)
AST: 22 U/L (ref 0–37)
Albumin: 4.4 g/dL (ref 3.5–5.2)
Alkaline Phosphatase: 51 U/L (ref 39–117)
Bilirubin, Direct: 0.1 mg/dL (ref 0.0–0.3)
Total Bilirubin: 0.6 mg/dL (ref 0.2–1.2)
Total Protein: 7.3 g/dL (ref 6.0–8.3)

## 2018-10-24 LAB — PSA: PSA: 2.13 ng/mL (ref 0.10–4.00)

## 2018-10-24 LAB — TSH: TSH: 1.47 u[IU]/mL (ref 0.35–4.50)

## 2018-10-24 NOTE — Assessment & Plan Note (Signed)
No relapse 

## 2018-10-24 NOTE — Assessment & Plan Note (Signed)
Losartan 

## 2018-10-24 NOTE — Patient Instructions (Signed)
If you have medicare related insurance (such as traditional Medicare, Blue Cross Medicare, United HealthCare Medicare, or similar), Please make an appointment at the scheduling desk with Jill, the Wellness Health Coach, for your Wellness visit in this office, which is a benefit with your insurance.  

## 2018-10-24 NOTE — Assessment & Plan Note (Signed)
Mild TUMs Protonix

## 2018-10-24 NOTE — Assessment & Plan Note (Deleted)
x2 years

## 2018-10-24 NOTE — Addendum Note (Signed)
Addended by: Karren Cobble on: 10/24/2018 09:15 AM   Modules accepted: Orders

## 2018-10-24 NOTE — Progress Notes (Signed)
Subjective:  Patient ID: Brent Wright, male    DOB: Nov 13, 1950  Age: 68 y.o. MRN: [id number]  CC: No chief complaint on file.   HPI Brent Wright presents for HTN, ED, occ GERD - rare  Outpatient Medications Prior to Visit  Medication Sig Dispense Refill  . Cholecalciferol (VITAMIN D3) 2000 units capsule Take 1 capsule (2,000 Units total) by mouth daily. 100 capsule 3  . ciclopirox (PENLAC) 8 % solution Apply topically at bedtime. Apply over nail and surrounding skin. Apply daily over previous coat 6.6 mL 5  . latanoprost (XALATAN) 0.005 % ophthalmic solution INSTILL 1 DROP INTO BOTH EYES EVERY DAY AT NIGHT    . losartan (COZAAR) 100 MG tablet Take 1 tablet (100 mg total) by mouth daily. 90 tablet 3   No facility-administered medications prior to visit.     ROS: Review of Systems  Constitutional: Negative for appetite change, fatigue and unexpected weight change.  HENT: Negative for congestion, nosebleeds, sneezing, sore throat and trouble swallowing.   Eyes: Negative for itching and visual disturbance.  Respiratory: Negative for cough.   Cardiovascular: Negative for chest pain, palpitations and leg swelling.  Gastrointestinal: Negative for abdominal distention, blood in stool, diarrhea and nausea.  Genitourinary: Negative for frequency and hematuria.  Musculoskeletal: Negative for back pain, gait problem, joint swelling and neck pain.  Skin: Negative for rash.  Neurological: Negative for dizziness, tremors, speech difficulty and weakness.  Psychiatric/Behavioral: Negative for agitation, dysphoric mood, sleep disturbance and suicidal ideas. The patient is not nervous/anxious.     Objective:  BP 134/82 (BP Location: Left Arm, Patient Position: Sitting, Cuff Size: Large)   Pulse 60   Temp 98.2 F (36.8 C) (Oral)   Ht 5\' 9"  (1.753 m)   Wt 200 lb (90.7 kg)   SpO2 97%   BMI 29.53 kg/m   BP Readings from Last 3 Encounters:  10/24/18 134/82  07/27/18 (!) 147/75  06/20/18 (!)  152/94    Wt Readings from Last 3 Encounters:  10/24/18 200 lb (90.7 kg)  07/27/18 199 lb (90.3 kg)  07/13/18 199 lb 3.2 oz (90.4 kg)    Physical Exam Constitutional:      General: He is not in acute distress.    Appearance: He is well-developed.     Comments: NAD  Eyes:     Conjunctiva/sclera: Conjunctivae normal.     Pupils: Pupils are equal, round, and reactive to light.  Neck:     Musculoskeletal: Normal range of motion.     Thyroid: No thyromegaly.     Vascular: No JVD.  Cardiovascular:     Rate and Rhythm: Normal rate and regular rhythm.     Heart sounds: Normal heart sounds. No murmur. No friction rub. No gallop.   Pulmonary:     Effort: Pulmonary effort is normal. No respiratory distress.     Breath sounds: Normal breath sounds. No wheezing or rales.  Chest:     Chest wall: No tenderness.  Abdominal:     General: Bowel sounds are normal. There is no distension.     Palpations: Abdomen is soft. There is no mass.     Tenderness: There is no abdominal tenderness. There is no guarding or rebound.  Musculoskeletal: Normal range of motion.        General: No tenderness.  Lymphadenopathy:     Cervical: No cervical adenopathy.  Skin:    General: Skin is warm and dry.     Findings: No rash.  Neurological:  Mental Status: He is alert and oriented to person, place, and time.     Cranial Nerves: No cranial nerve deficit.     Motor: No abnormal muscle tone.     Coordination: Coordination normal.     Gait: Gait normal.     Deep Tendon Reflexes: Reflexes are normal and symmetric.  Psychiatric:        Behavior: Behavior normal.        Thought Content: Thought content normal.        Judgment: Judgment normal.    rectal - recent per GI  Lab Results  Component Value Date   WBC 6.6 06/20/2018   HGB 16.1 06/20/2018   HCT 47.2 06/20/2018   PLT 211.0 06/20/2018   GLUCOSE 116 (H) 06/20/2018   CHOL 209 (H) 06/20/2018   TRIG 104.0 06/20/2018   HDL 48.80 06/20/2018    LDLCALC 139 (H) 06/20/2018   ALT 24 06/20/2018   AST 24 06/20/2018   NA 139 06/20/2018   K 4.5 06/20/2018   CL 104 06/20/2018   CREATININE 1.10 06/20/2018   BUN 18 06/20/2018   CO2 27 06/20/2018   TSH 0.91 06/20/2018   PSA 2.48 06/20/2018    No results found.  Assessment & Plan:   There are no diagnoses linked to this encounter.   No orders of the defined types were placed in this encounter.    Follow-up: No follow-ups on file.  Walker Kehr, MD

## 2018-10-25 LAB — HEPATITIS C ANTIBODY
Hepatitis C Ab: NONREACTIVE
SIGNAL TO CUT-OFF: 0.03 (ref <1.00)

## 2019-03-13 DIAGNOSIS — H524 Presbyopia: Secondary | ICD-10-CM | POA: Diagnosis not present

## 2019-03-13 DIAGNOSIS — H43813 Vitreous degeneration, bilateral: Secondary | ICD-10-CM | POA: Diagnosis not present

## 2019-03-13 DIAGNOSIS — H52203 Unspecified astigmatism, bilateral: Secondary | ICD-10-CM | POA: Diagnosis not present

## 2019-03-13 DIAGNOSIS — H40023 Open angle with borderline findings, high risk, bilateral: Secondary | ICD-10-CM | POA: Diagnosis not present

## 2019-03-13 DIAGNOSIS — H40053 Ocular hypertension, bilateral: Secondary | ICD-10-CM | POA: Diagnosis not present

## 2019-03-13 DIAGNOSIS — H47393 Other disorders of optic disc, bilateral: Secondary | ICD-10-CM | POA: Diagnosis not present

## 2019-03-13 DIAGNOSIS — H5213 Myopia, bilateral: Secondary | ICD-10-CM | POA: Diagnosis not present

## 2019-04-05 ENCOUNTER — Ambulatory Visit: Payer: PPO | Attending: Internal Medicine

## 2019-04-05 DIAGNOSIS — Z23 Encounter for immunization: Secondary | ICD-10-CM

## 2019-04-05 NOTE — Progress Notes (Signed)
   Covid-19 Vaccination Clinic  Name:  Brent Wright    MRN: [id number] DOB: February 19, 1950  04/05/2019  [name] was observed post Covid-19 immunization for 15 minutes without incident. He was provided with Vaccine Information Sheet and instruction to access the V-Safe system.   [name] was instructed to call 911 with any severe reactions post vaccine: Marland Kitchen Difficulty breathing  . Swelling of face and throat  . A fast heartbeat  . A bad rash all over body  . Dizziness and weakness   Immunizations Administered    Name Date Dose VIS Date Route   Pfizer COVID-19 Vaccine 04/05/2019  8:21 AM 0.3 mL 12/28/2018 Intramuscular   Manufacturer: [place]   Lot: [id number]   [place]: [id number]

## 2019-05-01 ENCOUNTER — Ambulatory Visit: Payer: PPO | Attending: Internal Medicine

## 2019-05-01 DIAGNOSIS — Z23 Encounter for immunization: Secondary | ICD-10-CM

## 2019-05-01 NOTE — Progress Notes (Signed)
   Covid-19 Vaccination Clinic  Name:  Brent Wright    MRN: [id number] DOB: [birth year]  05/01/2019  [name] was observed post Covid-19 immunization for 15 minutes without incident. He was provided with Vaccine Information Sheet and instruction to access the V-Safe system.   [name] was instructed to call 911 with any severe reactions post vaccine: Marland Kitchen Difficulty breathing  . Swelling of face and throat  . A fast heartbeat  . A bad rash all over body  . Dizziness and weakness   Immunizations Administered    Name Date Dose VIS Date Route   Pfizer COVID-19 Vaccine 05/01/2019  8:08 AM 0.3 mL 12/28/2018 Intramuscular   Manufacturer: [place]   Lot: [id number]   [place]: [id number]

## 2019-06-03 ENCOUNTER — Other Ambulatory Visit: Payer: Self-pay | Admitting: Internal Medicine

## 2019-06-28 ENCOUNTER — Other Ambulatory Visit: Payer: Self-pay | Admitting: Internal Medicine

## 2019-08-01 ENCOUNTER — Ambulatory Visit (INDEPENDENT_AMBULATORY_CARE_PROVIDER_SITE_OTHER): Payer: PPO | Admitting: Internal Medicine

## 2019-08-01 ENCOUNTER — Other Ambulatory Visit: Payer: Self-pay

## 2019-08-01 ENCOUNTER — Encounter: Payer: Self-pay | Admitting: Internal Medicine

## 2019-08-01 DIAGNOSIS — A048 Other specified bacterial intestinal infections: Secondary | ICD-10-CM

## 2019-08-01 DIAGNOSIS — K219 Gastro-esophageal reflux disease without esophagitis: Secondary | ICD-10-CM | POA: Diagnosis not present

## 2019-08-01 DIAGNOSIS — I10 Essential (primary) hypertension: Secondary | ICD-10-CM

## 2019-08-01 MED ORDER — PANTOPRAZOLE SODIUM 40 MG PO TBEC
40.0000 mg | DELAYED_RELEASE_TABLET | Freq: Every day | ORAL | 3 refills | Status: DC
Start: 2019-08-01 — End: 2019-10-11

## 2019-08-01 NOTE — Progress Notes (Signed)
Subjective:  Patient ID: Brent Wright, male    DOB: [birth year]  Age: 69 y.o. MRN: [id number]  CC: No chief complaint on file.   HPI Brent Wright presents for low BP - treated for HTN C/o GERD - daily; worse. H/o treated H pylori infection....  Outpatient Medications Prior to Visit  Medication Sig Dispense Refill  . Cholecalciferol (VITAMIN D3) 2000 units capsule Take 1 capsule (2,000 Units total) by mouth daily. 100 capsule 3  . latanoprost (XALATAN) 0.005 % ophthalmic solution INSTILL 1 DROP INTO BOTH EYES EVERY DAY AT NIGHT    . losartan (COZAAR) 100 MG tablet Take 1 tablet (100 mg total) by mouth daily. 90 tablet 3  . ciclopirox (PENLAC) 8 % solution Apply topically at bedtime. Apply over nail and surrounding skin. Apply daily over previous coat 6.6 mL 5   No facility-administered medications prior to visit.    ROS: Review of Systems  Constitutional: Negative for appetite change, fatigue and unexpected weight change.  HENT: Negative for congestion, nosebleeds, sneezing, sore throat and trouble swallowing.   Eyes: Negative for itching and visual disturbance.  Respiratory: Negative for cough.   Cardiovascular: Negative for chest pain, palpitations and leg swelling.  Gastrointestinal: Negative for abdominal distention, blood in stool, diarrhea and nausea.  Genitourinary: Negative for frequency and hematuria.  Musculoskeletal: Negative for back pain, gait problem, joint swelling and neck pain.  Skin: Negative for rash.  Neurological: Negative for dizziness, tremors, speech difficulty and weakness.  Psychiatric/Behavioral: Negative for agitation, dysphoric mood and sleep disturbance. The patient is not nervous/anxious.     Objective:  BP 132/70 (BP Location: Right Arm, Patient Position: Sitting, Cuff Size: Large)   Pulse 82   Temp 98.7 F (37.1 C) (Oral)   Ht 5\' 9"  (1.753 m)   Wt 197 lb (89.4 kg)   SpO2 97%   BMI 29.09 kg/m   BP Readings from Last 3 Encounters:    08/01/19 132/70  10/24/18 134/82  07/27/18 (!) 147/75    Wt Readings from Last 3 Encounters:  08/01/19 197 lb (89.4 kg)  10/24/18 200 lb (90.7 kg)  07/27/18 199 lb (90.3 kg)    Physical Exam Constitutional:      General: He is not in acute distress.    Appearance: He is well-developed.     Comments: NAD  Eyes:     Conjunctiva/sclera: Conjunctivae normal.     Pupils: Pupils are equal, round, and reactive to light.  Neck:     Thyroid: No thyromegaly.     Vascular: No JVD.  Cardiovascular:     Rate and Rhythm: Normal rate and regular rhythm.     Heart sounds: Normal heart sounds. No murmur heard.  No friction rub. No gallop.   Pulmonary:     Effort: Pulmonary effort is normal. No respiratory distress.     Breath sounds: Normal breath sounds. No wheezing or rales.  Chest:     Chest wall: No tenderness.  Abdominal:     General: Bowel sounds are normal. There is no distension.     Palpations: Abdomen is soft. There is no mass.     Tenderness: There is no abdominal tenderness. There is no guarding or rebound.  Musculoskeletal:        General: No tenderness. Normal range of motion.     Cervical back: Normal range of motion.  Lymphadenopathy:     Cervical: No cervical adenopathy.  Skin:    General: Skin is warm and dry.  Findings: No rash.  Neurological:     Mental Status: He is alert and oriented to person, place, and time.     Cranial Nerves: No cranial nerve deficit.     Motor: No abnormal muscle tone.     Coordination: Coordination normal.     Gait: Gait normal.     Deep Tendon Reflexes: Reflexes are normal and symmetric.  Psychiatric:        Behavior: Behavior normal.        Thought Content: Thought content normal.        Judgment: Judgment normal.     Lab Results  Component Value Date   WBC 5.6 10/24/2018   HGB 15.1 10/24/2018   HCT 44.1 10/24/2018   PLT 203.0 10/24/2018   GLUCOSE 84 10/24/2018   CHOL 203 (H) 10/24/2018   TRIG 100.0 10/24/2018   HDL  43.40 10/24/2018   LDLCALC 139 (H) 10/24/2018   ALT 16 10/24/2018   AST 22 10/24/2018   NA 142 10/24/2018   K 4.3 10/24/2018   CL 107 10/24/2018   CREATININE 1.04 10/24/2018   BUN 17 10/24/2018   CO2 27 10/24/2018   TSH 1.47 10/24/2018   PSA 2.13 10/24/2018    No results found.  Assessment & Plan:    Walker Kehr, MD

## 2019-08-01 NOTE — Patient Instructions (Addendum)
Reduce Losartan to 50 mg/day

## 2019-08-01 NOTE — Assessment & Plan Note (Addendum)
Low BP at home Reduce Losartan to 50 mg/day

## 2019-08-01 NOTE — Assessment & Plan Note (Signed)
Treated 2019

## 2019-08-01 NOTE — Assessment & Plan Note (Addendum)
Worse Protonix daily GI ref

## 2019-10-11 ENCOUNTER — Encounter: Payer: Self-pay | Admitting: Gastroenterology

## 2019-10-11 ENCOUNTER — Ambulatory Visit: Payer: PPO | Admitting: Gastroenterology

## 2019-10-11 VITALS — BP 122/68 | HR 80 | Ht 68.0 in | Wt 194.0 lb

## 2019-10-11 DIAGNOSIS — R768 Other specified abnormal immunological findings in serum: Secondary | ICD-10-CM | POA: Diagnosis not present

## 2019-10-11 DIAGNOSIS — K219 Gastro-esophageal reflux disease without esophagitis: Secondary | ICD-10-CM

## 2019-10-11 DIAGNOSIS — R12 Heartburn: Secondary | ICD-10-CM

## 2019-10-11 DIAGNOSIS — Z86010 Personal history of colonic polyps: Secondary | ICD-10-CM | POA: Diagnosis not present

## 2019-10-11 NOTE — Progress Notes (Signed)
Brent Wright VISIT   Primary Care Provider Plotnikov, Evie Lacks, MD [place] [address] [phone]   Patient Profile: Brent Wright is a 69 y.o. male with a pmh significant for hypertension, glaucoma, seasonal allergies, colon polyps (adenomatous).  The patient presents to the [facility] for an evaluation and management of problem(s) noted below:  Problem List 1. Gastroesophageal reflux disease, unspecified whether esophagitis present   2. Pyrosis   3. Positive Helicobacter pylori serology   4. Hx of adenomatous colonic polyps     History of Present Illness This is a patient whom I met back in 2020 in the setting of a screening colonoscopy.  He was found to have adenomatous colon polyps.  He returns to clinic today for an in person visit.  The [place] interpreter is available for his visit today.  He has been experiencing new onset heartburn over the course of the previous months.  He ended up seeing his primary care provider who started him on PPI therapy and subsequently has had improvement in his symptoms.  He denies overt dysphagia or odynophagia.  He has never had this significant amount of persistent heartburn symptoms previously.  No new medications.  No changes in his diet.  No significant weight gain.  He denies nocturnal coughing spells.  He denies water brash.  No new dental caries.  He has never had an upper endoscopy.  He describes when he was in his college years being tested for gastric acid via a monitor and was told that he had low gastric acid.  Back in 2019 he was tested for H. pylori and was found to have an IgG serology positivity.  He was treated.  Fecal testing to evaluate for potential eradication was not performed.  Patient does not take significant nonsteroidals or BC/Goody powders.  There is no family history of esophageal cancer.  He has never used tobacco products.  GI Review of Systems Positive as  above Negative for nausea, vomiting, pain, change in bowel movements, melena, hematochezia  Review of Systems General: Denies fevers/chills/weight loss unintentionally HEENT: Denies oral lesions/sore throat Cardiovascular: Denies chest pain/palpitations Pulmonary: Denies shortness of breath Gastroenterological: See HPI Genitourinary: Denies darkened urine Hematological: Denies easy bruising/bleeding Dermatological: Denies jaundice Psychological: Mood is stable   Medications Current Outpatient Medications  Medication Sig Dispense Refill  . Cholecalciferol (VITAMIN D3) 2000 units capsule Take 1 capsule (2,000 Units total) by mouth daily. 100 capsule 3  . latanoprost (XALATAN) 0.005 % ophthalmic solution INSTILL 1 DROP INTO BOTH EYES EVERY DAY AT NIGHT    . losartan (COZAAR) 100 MG tablet Take 1 tablet (100 mg total) by mouth daily. 90 tablet 3  . ciclopirox (PENLAC) 8 % solution Apply topically at bedtime. Apply over nail and surrounding skin. Apply daily over previous coat 6.6 mL 5   No current facility-administered medications for this visit.    Allergies No Known Allergies  Histories Past Medical History:  Diagnosis Date  . Allergy    seasonal  . Glaucoma   . Hypertension    Past Surgical History:  Procedure Laterality Date  . APPENDECTOMY    . INGUINAL HERNIA REPAIR Left 02/02/2018   Procedure: OPEN REPAIR OF LEFT INGUINAL HERNIA WITH MESH;  Surgeon: Fanny Skates, MD;  Location: [facility];  Service: General;  Laterality: Left;  . INSERTION OF MESH Left 02/02/2018   Procedure: INSERTION OF MESH;  Surgeon: Fanny Skates, MD;  Location: [facility];  Service: General;  Laterality: Left;   Social History   Socioeconomic History  . Marital status: Married    Spouse name: Not on file  . Number of children: Not on file  . Years of education: Not on file  . Highest education level: Not on file  Occupational History  . Not on file    Tobacco Use  . Smoking status: Never Smoker  . Smokeless tobacco: Never Used  Vaping Use  . Vaping Use: Never used  Substance and Sexual Activity  . Alcohol use: Yes    Alcohol/week: 0.0 standard drinks    Comment: causal  . Drug use: Never  . Sexual activity: Yes  Other Topics Concern  . Not on file  Social History Narrative  . Not on file   Social Determinants of Health   Financial Resource Strain:   . Difficulty of Paying Living Expenses: Not on file  Food Insecurity:   . Worried About Charity fundraiser in the Last Year: Not on file  . Ran Out of Food in the Last Year: Not on file  Transportation Needs:   . Lack of Transportation (Medical): Not on file  . Lack of Transportation (Non-Medical): Not on file  Physical Activity:   . Days of Exercise per Week: Not on file  . Minutes of Exercise per Session: Not on file  Stress:   . Feeling of Stress : Not on file  Social Connections:   . Frequency of Communication with Friends and Family: Not on file  . Frequency of Social Gatherings with Friends and Family: Not on file  . Attends Religious Services: Not on file  . Active Member of Clubs or Organizations: Not on file  . Attends Archivist Meetings: Not on file  . Marital Status: Not on file  Intimate Partner Violence:   . Fear of Current or Ex-Partner: Not on file  . Emotionally Abused: Not on file  . Physically Abused: Not on file  . Sexually Abused: Not on file   Family History  Problem Relation Age of Onset  . Hypertension Mother   . Hypertension Father   . Colon cancer Neg Hx   . Colon polyps Neg Hx   . Esophageal cancer Neg Hx   . Rectal cancer Neg Hx   . Stomach cancer Neg Hx   . Inflammatory bowel disease Neg Hx   . Liver disease Neg Hx   . Pancreatic cancer Neg Hx    I have reviewed his medical, social, and family history in detail and updated the electronic medical record as necessary.    PHYSICAL EXAMINATION  BP 122/68   Pulse 80    Ht 5' 8" (1.727 m)   Wt 194 lb (88 kg)   BMI 29.50 kg/m  Wt Readings from Last 3 Encounters:  10/11/19 194 lb (88 kg)  08/01/19 197 lb (89.4 kg)  10/24/18 200 lb (90.7 kg)  GEN: NAD, appears stated age, doesn't appear chronically ill PSYCH: Cooperative, without pressured speech EYE: Conjunctivae pink, sclerae anicteric ENT: MMM, without oral ulcers CV: Nontachycardic RESP: No audible wheezing GI: NABS, soft, NT/ND, without rebound or guarding MSK/EXT: No lower extremity edema SKIN: No jaundice NEURO:  Alert & Oriented x 3, no focal deficits   REVIEW OF DATA  I reviewed the following data at the time of this encounter:  GI Procedures and Studies  2020 Colonoscopy - Hemorrhoids found on digital rectal exam. - The examined portion of the ileum was normal. -  A single non-bleeding colonic angioectasia. - Five 2 to 6 mm polyps in the rectum, at the recto-sigmoid colon and in the ascending colon, removed with a cold snare. Resected and retrieved. - Normal mucosa in the entire examined colon otherwise. - Non-bleeding non-thrombosed internal hemorrhoids. Diagnosis Surgical [P], colon, ascending, rectosigmoid, rectum, polyp (5) - TUBULAR ADENOMA (MULTIPLE FRAGMENTS). - HYPERPLASTIC POLYP. - NO HIGH GRADE DYSPLASIA OR MALIGNANCY.  Laboratory Studies  Reviewed those in epic  Imaging Studies  No relevant studies to review   ASSESSMENT  Brent Wright is a 69 y.o. male with a pmh significant for hypertension, glaucoma, seasonal allergies, colon polyps (adenomatous).  The patient is seen today for evaluation and management of:  1. Gastroesophageal reflux disease, unspecified whether esophagitis present   2. Pyrosis   3. Positive Helicobacter pylori serology   4. Hx of adenomatous colonic polyps    The patient is hemodynamically stable.  Clinically, he has had new onset development of GERD symptoms over the age of 47.  Although he is not having overt dysphagia, it is important that  we strongly consider ruling out Barrett's esophagus as well as other processes such as esophagitis or underlying malignancy.  Eosinophilic esophagitis seems less likely but will be considered as well.  Diagnostic endoscopy is recommended.  We will continue current PPI dosing for now.  I will obtain gastric biopsies to ensure that this question of prior H. pylori history is determined to be negative for any further need of treatment although his symptoms do not sound H. pylori related currently.  Further recommendations to be dictated after the completion of his diagnostic endoscopy.  The risks and benefits of endoscopic evaluation were discussed with the patient; these include but are not limited to the risk of perforation, infection, bleeding, missed lesions, lack of diagnosis, severe illness requiring hospitalization, as well as anesthesia and sedation related illnesses.  The patient is agreeable to proceed.  All patient questions were answered to the best of my ability, and the patient agrees to the aforementioned plan of action with follow-up as indicated.   PLAN  Continue current PPI dosing Diagnostic endoscopy with esophageal and gastric biopsies at minimum to be obtained Further work-up and consideration of manometry/pH impedance testing to be considered thereafter   Orders Placed This Encounter  Procedures  . Ambulatory referral to Gastroenterology    New Prescriptions   No medications on file   Modified Medications   No medications on file    Planned Follow Up No follow-ups on file.   Total Time in Face-to-Face and in Coordination of Care for patient including independent/personal interpretation/review of prior testing, medical history, examination, medication adjustment, communicating results with the patient directly, and documentation with the EHR is 35 minutes (as a result of having the interpreter available for the clinic visit as well).   Justice Britain, MD [facility]oscopy Office # [phone]

## 2019-10-11 NOTE — Patient Instructions (Addendum)
Continue taking Pantoprazole daily.   You have been scheduled for an endoscopy. Please follow written instructions given to you at your visit today. If you use inhalers (even only as needed), please bring them with you on the day of your procedure.   If you are age 68 or older, your body mass index should be between 23-30. Your Body mass index is 29.5 kg/m. If this is out of the aforementioned range listed, please consider follow up with your Primary Care Provider.  If you are age 70 or younger, your body mass index should be between 19-25. Your Body mass index is 29.5 kg/m. If this is out of the aformentioned range listed, please consider follow up with your Primary Care Provider.    Thank you for choosing me and [facility].  Dr. Rush Landmark

## 2019-10-13 ENCOUNTER — Encounter: Payer: Self-pay | Admitting: Gastroenterology

## 2019-10-13 DIAGNOSIS — Z860101 Personal history of adenomatous and serrated colon polyps: Secondary | ICD-10-CM | POA: Insufficient documentation

## 2019-10-13 DIAGNOSIS — R12 Heartburn: Secondary | ICD-10-CM | POA: Insufficient documentation

## 2019-10-13 DIAGNOSIS — Z86010 Personal history of colonic polyps: Secondary | ICD-10-CM | POA: Insufficient documentation

## 2019-10-13 DIAGNOSIS — R768 Other specified abnormal immunological findings in serum: Secondary | ICD-10-CM | POA: Insufficient documentation

## 2019-10-17 ENCOUNTER — Encounter: Payer: Self-pay | Admitting: Gastroenterology

## 2019-10-17 ENCOUNTER — Other Ambulatory Visit: Payer: Self-pay

## 2019-10-17 ENCOUNTER — Ambulatory Visit (AMBULATORY_SURGERY_CENTER): Payer: PPO | Admitting: Gastroenterology

## 2019-10-17 VITALS — BP 138/81 | HR 49 | Temp 97.7°F | Resp 15 | Ht 68.0 in | Wt 194.0 lb

## 2019-10-17 DIAGNOSIS — K228 Other specified diseases of esophagus: Secondary | ICD-10-CM | POA: Diagnosis not present

## 2019-10-17 DIAGNOSIS — K259 Gastric ulcer, unspecified as acute or chronic, without hemorrhage or perforation: Secondary | ICD-10-CM | POA: Diagnosis not present

## 2019-10-17 DIAGNOSIS — I10 Essential (primary) hypertension: Secondary | ICD-10-CM | POA: Diagnosis not present

## 2019-10-17 DIAGNOSIS — K219 Gastro-esophageal reflux disease without esophagitis: Secondary | ICD-10-CM | POA: Diagnosis not present

## 2019-10-17 DIAGNOSIS — K449 Diaphragmatic hernia without obstruction or gangrene: Secondary | ICD-10-CM

## 2019-10-17 DIAGNOSIS — K319 Disease of stomach and duodenum, unspecified: Secondary | ICD-10-CM | POA: Diagnosis not present

## 2019-10-17 DIAGNOSIS — K2970 Gastritis, unspecified, without bleeding: Secondary | ICD-10-CM

## 2019-10-17 DIAGNOSIS — K3189 Other diseases of stomach and duodenum: Secondary | ICD-10-CM | POA: Diagnosis not present

## 2019-10-17 DIAGNOSIS — K2100 Gastro-esophageal reflux disease with esophagitis, without bleeding: Secondary | ICD-10-CM

## 2019-10-17 DIAGNOSIS — K2950 Unspecified chronic gastritis without bleeding: Secondary | ICD-10-CM | POA: Diagnosis not present

## 2019-10-17 MED ORDER — OMEPRAZOLE 40 MG PO CPDR: DELAYED_RELEASE_CAPSULE | ORAL | 1 refills | Status: DC

## 2019-10-17 MED ORDER — SODIUM CHLORIDE 0.9 % IV SOLN
500.0000 mL | Freq: Once | INTRAVENOUS | Status: DC
Start: 2019-10-17 — End: 2019-10-17

## 2019-10-17 NOTE — Progress Notes (Signed)
Report given to PACU, vss 

## 2019-10-17 NOTE — Progress Notes (Signed)
Provider schedule unavailable at this time for 3 month repeat EGD. Recall entered [phone]

## 2019-10-17 NOTE — Progress Notes (Signed)
Pt's states no medical or surgical changes since previsit or office visit.  WS IV and KW vitals.

## 2019-10-17 NOTE — Progress Notes (Signed)
Called to room to assist during endoscopic procedure.  Patient ID and intended procedure confirmed with present staff. Received instructions for my participation in the procedure from the performing physician.  

## 2019-10-17 NOTE — Progress Notes (Signed)
1017 Robinul 0.1 mg IV given due large amount of secretions upon assessment.  MD made aware, vss  

## 2019-10-17 NOTE — Op Note (Signed)
[facility] Patient Name: Brent Wright Procedure Date: 10/17/2019 10:11 AM MRN: [id number] Endoscopist: Justice Britain , MD Age: 69 Referring MD:  Date of Birth: 19-May-1950 Gender: Male Account #: [id number] Procedure:                Upper GI endoscopy Indications:              Suspected gastro-esophageal reflux disease - new                            onset after age 89, Follow-up of gastro-esophageal                            reflux disease, Follow-up of Helicobacter pylori                            (previous serology positive and treated before                            seeing GI) Medicines:                Monitored Anesthesia Care Procedure:                Pre-Anesthesia Assessment:                           - Prior to the procedure, a History and Physical                            was performed, and patient medications and                            allergies were reviewed. The patient's tolerance of                            previous anesthesia was also reviewed. The risks                            and benefits of the procedure and the sedation                            options and risks were discussed with the patient.                            All questions were answered, and informed consent                            was obtained. Prior Anticoagulants: The patient has                            taken no previous anticoagulant or antiplatelet                            agents. ASA Grade Assessment: II - A patient with  mild systemic disease. After reviewing the risks                            and benefits, the patient was deemed in                            satisfactory condition to undergo the procedure.                           After obtaining informed consent, the endoscope was                            passed under direct vision. Throughout the                            procedure, the patient's blood pressure, pulse,  and                            oxygen saturations were monitored continuously. The                            Endoscope was introduced through the mouth, and                            advanced to the second part of duodenum. The upper                            GI endoscopy was accomplished without difficulty.                            The patient tolerated the procedure. Scope In: Scope Out: Findings:                 No gross lesions were noted in the proximal                            esophagus and in the mid esophagus. Biopsies were                            taken with a cold forceps for histology to rule out                            EoE/LoE.                           LA Grade A (one or more mucosal breaks less than 5                            mm, not extending between tops of 2 mucosal folds)                            esophagitis with no bleeding was found in the  distal esophagus.                           Two tongues of salmon-colored mucosa were present                            from 41 to 42 cm. No other visible abnormalities                            were present. The maximum longitudinal extent of                            these esophageal mucosal changes was 1 cm in                            length. Biopsies were taken with a cold forceps for                            histology.                           The Z-line was irregular and was found 42 cm from                            the incisors.                           A 3 cm hiatal hernia was present.                           One non-bleeding superficial gastric ulcer with a                            clean ulcer base (Forrest Class III) was found in                            the gastric antrum. The lesion was 6 mm in largest                            dimension.                           Patchy moderate inflammation characterized by                            erosions and granularity  was found in the gastric                            body, at the incisura and in the gastric antrum.                           No other gross lesions were noted in the entire  examined stomach. Biopsies were taken with a cold                            forceps for histology and Helicobacter pylori                            testing.                           No gross lesions were noted in the duodenal bulb,                            in the first portion of the duodenum and in the                            second portion of the duodenum. Complications:            No immediate complications. Estimated Blood Loss:     Estimated blood loss was minimal. Impression:               - No gross lesions in esophagus proximally.                            Biopsied.                           - LA Grade A esophagitis with no bleeding distally.                           - Salmon-colored mucosa suspicious for                            short-segment Barrett's esophagus. Biopsied.                           - Z-line irregular, 42 cm from the incisors.                           - 3 cm hiatal hernia.                           - Non-bleeding gastric ulcer with a clean ulcer                            base (Forrest Class III). Gastritis. No other gross                            lesions in the stomach. Biopsied for HP.                           - No gross lesions in the duodenal bulb, in the                            first portion of the duodenum and in the second  portion of the duodenum. Recommendation:           - The patient will be observed post-procedure,                            until all discharge criteria are met.                           - Discharge patient to home.                           - Patient has a contact number available for                            emergencies. The signs and symptoms of potential                             delayed complications were discussed with the                            patient. Return to normal activities tomorrow.                            Written discharge instructions were provided to the                            patient.                           - Resume previous diet.                           - Continue present medications.                           - Await pathology results.                           - Initiate Omeprazole 40 mg twice daily x 18-month                           and then 40 mg daily until follow up endoscopy.                           - Repeat upper endoscopy in 3 months to check                            healing of esophagitis and gastric ulcer.                           - The findings and recommendations were discussed                            with the patient. GJustice Britain MD 10/17/2019 10:42:50 AM

## 2019-10-17 NOTE — Patient Instructions (Signed)
Handouts on hiatal hernia and gastritis given to you today  Await pathology results    YOU HAD AN ENDOSCOPIC PROCEDURE TODAY AT [facility]:   Refer to the procedure report that was given to you for any specific questions about what was found during the examination.  If the procedure report does not answer your questions, please call your gastroenterologist to clarify.  If you requested that your care partner not be given the details of your procedure findings, then the procedure report has been included in a sealed envelope for you to review at your convenience later.  YOU SHOULD EXPECT: Some feelings of bloating in the abdomen. Passage of more gas than usual.  Walking can help get rid of the air that was put into your GI tract during the procedure and reduce the bloating. If you had a lower endoscopy (such as a colonoscopy or flexible sigmoidoscopy) you may notice spotting of blood in your stool or on the toilet paper. If you underwent a bowel prep for your procedure, you may not have a normal bowel movement for a few days.  Please Note:  You might notice some irritation and congestion in your nose or some drainage.  This is from the oxygen used during your procedure.  There is no need for concern and it should clear up in a day or so.  SYMPTOMS TO REPORT IMMEDIATELY:   Following upper endoscopy (EGD)  Vomiting of blood or coffee ground material  New chest pain or pain under the shoulder blades  Painful or persistently difficult swallowing  New shortness of breath  Fever of 100F or higher  Black, tarry-looking stools  For urgent or emergent issues, a gastroenterologist can be reached at any hour by calling [phone]. Do not use MyChart messaging for urgent concerns.    DIET:  We do recommend a small meal at first, but then you may proceed to your regular diet.  Drink plenty of fluids but you should avoid alcoholic beverages for 24 hours.  ACTIVITY:  You should plan  to take it easy for the rest of today and you should NOT DRIVE or use heavy machinery until tomorrow (because of the sedation medicines used during the test).    FOLLOW UP: Our staff will call the number listed on your records 48-72 hours following your procedure to check on you and address any questions or concerns that you may have regarding the information given to you following your procedure. If we do not reach you, we will leave a message.  We will attempt to reach you two times.  During this call, we will ask if you have developed any symptoms of COVID 19. If you develop any symptoms (ie: fever, flu-like symptoms, shortness of breath, cough etc.) before then, please call [phone].  If you test positive for Covid 19 in the 2 weeks post procedure, please call and report this information to [place].    If any biopsies were taken you will be contacted by phone or by letter within the next 1-3 weeks.  Please call us at [phone] if you have not heard about the biopsies in 3 weeks.    SIGNATURES/CONFIDENTIALITY: You and/or your care partner have signed paperwork which will be entered into your electronic medical record.  These signatures attest to the fact that that the information above on your After Visit Summary has been reviewed and is understood.  Full responsibility of the confidentiality of this discharge information lies with you  and/or your care-partner.

## 2019-10-21 ENCOUNTER — Telehealth: Payer: Self-pay

## 2019-10-21 NOTE — Telephone Encounter (Signed)
  Follow up Call-  Call back number 10/17/2019 07/27/2018  Post procedure Call Back phone  # [phone]  Permission to leave phone message Yes Yes  Some recent data might be hidden     Patient questions:  Do you have a fever, pain , or abdominal swelling? No. Pain Score  0 *  Have you tolerated food without any problems? Yes.    Have you been able to return to your normal activities? Yes.    Do you have any questions about your discharge instructions: Diet   No. Medications  No. Follow up visit  No.  Do you have questions or concerns about your Care? No.  Actions: * If pain score is 4 or above: No action needed, pain <4.  1. Have you developed a fever since your procedure? no  2.   Have you had an respiratory symptoms (SOB or cough) since your procedure? no  3.   Have you tested positive for COVID 19 since your procedure no  4.   Have you had any family members/close contacts diagnosed with the COVID 19 since your procedure?  no   If yes to any of these questions please route to Joylene John, RN and Joella Prince, RN

## 2019-10-24 ENCOUNTER — Encounter: Payer: Self-pay | Admitting: Gastroenterology

## 2019-11-29 ENCOUNTER — Encounter: Payer: Self-pay | Admitting: Gastroenterology

## 2019-11-29 ENCOUNTER — Ambulatory Visit: Payer: PPO | Admitting: Gastroenterology

## 2019-11-29 VITALS — BP 158/80 | HR 65 | Ht 68.0 in | Wt 197.0 lb

## 2019-11-29 DIAGNOSIS — K219 Gastro-esophageal reflux disease without esophagitis: Secondary | ICD-10-CM

## 2019-11-29 DIAGNOSIS — K31A0 Gastric intestinal metaplasia, unspecified: Secondary | ICD-10-CM

## 2019-11-29 DIAGNOSIS — K259 Gastric ulcer, unspecified as acute or chronic, without hemorrhage or perforation: Secondary | ICD-10-CM

## 2019-11-29 DIAGNOSIS — K2950 Unspecified chronic gastritis without bleeding: Secondary | ICD-10-CM

## 2019-11-29 NOTE — Patient Instructions (Addendum)
Decrease your Omeprazole to once daily in Dec 2021.   You will be due for a recall endoscopy in April 2021. We will send you a reminder in the mail when it gets closer to that time.  If you are age 68 or older, your body mass index should be between 23-30. Your Body mass index is 29.95 kg/m. If this is out of the aforementioned range listed, please consider follow up with your Primary Care Provider.  If you are age 66 or younger, your body mass index should be between 19-25. Your Body mass index is 29.95 kg/m. If this is out of the aformentioned range listed, please consider follow up with your Primary Care Provider.    Thank you for choosing me and [facility].  Dr. Rush Landmark

## 2019-11-29 NOTE — Progress Notes (Signed)
[facility] VISIT   Primary Care Provider Plotnikov, Evie Lacks, MD [place] [address] [phone]   Patient Profile: Brent Wright is a 69 y.o. male with a pmh significant for hypertension, glaucoma, seasonal allergies, possible prior H. pylori infection (status post biopsies that are negative), colon polyps (adenomatous), PUD (manifested as GU), GIM.  The patient presents to the [facility] for an evaluation and management of problem(s) noted below:  Problem List 1. Intestinal metaplasia of stomach   2. Gastric ulcer without hemorrhage or perforation, unspecified chronicity   3. Gastroesophageal reflux disease without esophagitis   4. Other chronic gastritis without hemorrhage     History of Present Illness Please see prior progress note for full details of HPI.  Interval History The patient returns post EGD. The patient is accompanied by a translator for today's visit. He was found to have gastritis with intestinal metaplasia as well as a gastric ulcer without evidence of H. pylori. Patient remains on PPI therapy. He is doing well. Patient denies any overt dysphagia or odynophagia. He states he is not having significant pyrosis. Previous abdominal pain is improved. Patient will be leaving the country with his wife in an effort of trying to find an apartment or home in [place].  GI Review of Systems Positive as above Negative for nausea, vomiting, pain, change in bowel movements, melena, hematochezia  Review of Systems General: Denies fevers/chills/weight loss unintentionally Cardiovascular: Denies chest pain/palpitations Pulmonary: Denies shortness of breath Gastroenterological: See HPI Genitourinary: Denies darkened urine Hematological: Denies easy bruising/bleeding Dermatological: Denies jaundice Psychological: Mood is stable    Medications Current Outpatient Medications  Medication Sig Dispense  Refill  . Cholecalciferol (VITAMIN D3) 2000 units capsule Take 1 capsule (2,000 Units total) by mouth daily. 100 capsule 3  . latanoprost (XALATAN) 0.005 % ophthalmic solution INSTILL 1 DROP INTO BOTH EYES EVERY DAY AT NIGHT    . losartan (COZAAR) 100 MG tablet Take 1 tablet (100 mg total) by mouth daily. 90 tablet 3  . omeprazole (PRILOSEC) 40 MG capsule Take 1 capsule (40 mg total) by mouth in the morning and at bedtime for 30 days, THEN 1 capsule (40 mg total) daily. 90 capsule 1   No current facility-administered medications for this visit.    Allergies No Known Allergies  Histories Past Medical History:  Diagnosis Date  . Allergy    seasonal  . GERD (gastroesophageal reflux disease)   . Glaucoma   . Hypertension    Past Surgical History:  Procedure Laterality Date  . APPENDECTOMY    . INGUINAL HERNIA REPAIR Left 02/02/2018   Procedure: OPEN REPAIR OF LEFT INGUINAL HERNIA WITH MESH;  Surgeon: Fanny Skates, MD;  Location: [facility];  Service: General;  Laterality: Left;  . INSERTION OF MESH Left 02/02/2018   Procedure: INSERTION OF MESH;  Surgeon: Fanny Skates, MD;  Location: [facility];  Service: General;  Laterality: Left;   Social History   Socioeconomic History  . Marital status: Married    Spouse name: Not on file  . Number of children: Not on file  . Years of education: Not on file  . Highest education level: Not on file  Occupational History  . Not on file  Tobacco Use  . Smoking status: Never Smoker  . Smokeless tobacco: Never Used  Vaping Use  . Vaping Use: Never used  Substance and Sexual Activity  . Alcohol use: Yes    Alcohol/week: 0.0  standard drinks    Comment: causal  . Drug use: Never  . Sexual activity: Yes  Other Topics Concern  . Not on file  Social History Narrative  . Not on file   Social Determinants of Health   Financial Resource Strain:   . Difficulty of Paying Living Expenses: Not on file  Food  Insecurity:   . Worried About Charity fundraiser in the Last Year: Not on file  . Ran Out of Food in the Last Year: Not on file  Transportation Needs:   . Lack of Transportation (Medical): Not on file  . Lack of Transportation (Non-Medical): Not on file  Physical Activity:   . Days of Exercise per Week: Not on file  . Minutes of Exercise per Session: Not on file  Stress:   . Feeling of Stress : Not on file  Social Connections:   . Frequency of Communication with Friends and Family: Not on file  . Frequency of Social Gatherings with Friends and Family: Not on file  . Attends Religious Services: Not on file  . Active Member of Clubs or Organizations: Not on file  . Attends Archivist Meetings: Not on file  . Marital Status: Not on file  Intimate Partner Violence:   . Fear of Current or Ex-Partner: Not on file  . Emotionally Abused: Not on file  . Physically Abused: Not on file  . Sexually Abused: Not on file   Family History  Problem Relation Age of Onset  . Hypertension Mother   . Hypertension Father   . Colon cancer Neg Hx   . Colon polyps Neg Hx   . Esophageal cancer Neg Hx   . Rectal cancer Neg Hx   . Stomach cancer Neg Hx   . Inflammatory bowel disease Neg Hx   . Liver disease Neg Hx   . Pancreatic cancer Neg Hx    I have reviewed his medical, social, and family history in detail and updated the electronic medical record as necessary.    PHYSICAL EXAMINATION  BP (!) 158/80   Pulse 65   Ht 5\' 8"  (1.727 m)   Wt 197 lb (89.4 kg)   SpO2 97%   BMI 29.95 kg/m  Wt Readings from Last 3 Encounters:  11/29/19 197 lb (89.4 kg)  10/17/19 194 lb (88 kg)  10/11/19 194 lb (88 kg)  GEN: NAD, appears stated age, doesn't appear chronically ill PSYCH: Cooperative, without pressured speech EYE: Conjunctivae pink, sclerae anicteric ENT: MMM CV: Nontachycardic RESP: No audible wheezing GI: NABS, soft, NT/ND, without rebound or guarding MSK/EXT: No lower extremity  edema SKIN: No jaundice NEURO:  Alert & Oriented x 3, no focal deficits   REVIEW OF DATA  I reviewed the following data at the time of this encounter:  GI Procedures and Studies  2021 EGD - No gross lesions in esophagus proximally. Biopsied. - LA Grade A esophagitis with no bleeding distally. - Salmon-colored mucosa suspicious for short-segment Barrett's esophagus. Biopsied. - Z-line irregular, 42 cm from the incisors. - 3 cm hiatal hernia. - Non-bleeding gastric ulcer with a clean ulcer base (Forrest Class III). Gastritis. No other gross lesions in the stomach. Biopsied for HP. - No gross lesions in the duodenal bulb, in the first portion of the duodenum and in the second portion of the duodenum. Pathology Diagnosis 1. Surgical [P], random gastric sites - GASTRIC ANTRAL MUCOSA WITH MILD REACTIVE GASTROPATHY AND INTESTINAL METAPLASIA, NEGATIVE FOR DYSPLASIA. - GASTRIC OXYNTIC MUCOSA WITH  MILD CHRONIC GASTRITIS. - WARTHIN-STARRY STAIN IS NEGATIVE FOR HELICOBACTER PYLORI. 2. Surgical [P], esophagus, random sites - SQUAMOUS ESOPHAGEAL EPITHELIUM WITH NO SIGNIFICANT PATHOLOGIC FINDINGS. - NEGATIVE FOR INCREASED INTRAEPITHELIAL EOSINOPHILS. 3. Surgical [P], esophagus, distal esophagus - GASTROESOPHAGEAL JUNCTION MUCOSA WITH REACTIVE/REGENERATIVE CHANGES. - NEGATIVE FOR INTESTINAL METAPLASIA (GOBLET CELL METAPLASIA).   Laboratory Studies  Reviewed those in epic  Imaging Studies  No relevant studies to review   ASSESSMENT  Mr. Cotten is a 69 y.o. male with a pmh significant for hypertension, glaucoma, seasonal allergies, possible prior H. pylori infection (status post biopsies that are negative), colon polyps (adenomatous), PUD (manifested as GU), GIM.  The patient is seen today for evaluation and management of:  1. Intestinal metaplasia of stomach   2. Gastric ulcer without hemorrhage or perforation, unspecified chronicity   3. Gastroesophageal reflux disease without esophagitis    4. Other chronic gastritis without hemorrhage    The patient is doing well clinically and hemodynamically. He remains on PPI therapy at this time and anticipate we will be able to start decreasing his dose. He is going to remain on once daily omeprazole 40 mg through November. In December he will transition to 20 mg daily, however he will have 20 mg twice daily dosing if his down titration leads to worsening symptoms. Hopefully will be able to maintain him on once daily 20 mg and see how he does moving forward. He will need repeat endoscopic evaluation to ensure healing of the gastric ulcer as well as to perform gastric mapping biopsies due to his history of intestinal metaplasia. We went over the definition of intestinal metaplasia as well as the need for gastric mapping and surveillance for individuals. He is inadequate candidate for gastric surveillance. We will plan for that in the next 4 to 6 months since the patient will be away/out of the country. He will let us know if other things develop in the interim.  All patient questions were answered to the best of my ability, and the patient agrees to the aforementioned plan of action with follow-up as indicated.    PLAN  Continue PPI 40 mg daily through November -Starting in December decreased to 20 mg daily -If issues occur as he down titrate then he will go back up to 20 mg twice daily (total 40 mg) Repeat EGD in 4 to 6 months -Gastric mapping biopsies to be performed -Surveillance for gastric ulcer healing In future if GERD symptoms become an issue then will consider pH impedance testing   No orders of the defined types were placed in this encounter.   New Prescriptions   No medications on file   Modified Medications   No medications on file    Planned Follow Up No follow-ups on file.   Total Time in Face-to-Face and in Coordination of Care for patient including independent/personal interpretation/review of prior testing, medical  history, examination, medication adjustment, communicating results with the patient directly, and documentation with the EHR is 25 minutes (as a result of having the interpreter available for the clinic visit as well).   Justice Britain, MD [facility] Advanced [facility]oscopy Office # [phone]

## 2019-12-01 DIAGNOSIS — Z20822 Contact with and (suspected) exposure to covid-19: Secondary | ICD-10-CM | POA: Diagnosis not present

## 2019-12-02 ENCOUNTER — Encounter: Payer: Self-pay | Admitting: Gastroenterology

## 2019-12-02 DIAGNOSIS — Z20822 Contact with and (suspected) exposure to covid-19: Secondary | ICD-10-CM | POA: Diagnosis not present

## 2019-12-02 DIAGNOSIS — K259 Gastric ulcer, unspecified as acute or chronic, without hemorrhage or perforation: Secondary | ICD-10-CM | POA: Insufficient documentation

## 2019-12-02 DIAGNOSIS — K31A0 Gastric intestinal metaplasia, unspecified: Secondary | ICD-10-CM | POA: Insufficient documentation

## 2019-12-02 DIAGNOSIS — K219 Gastro-esophageal reflux disease without esophagitis: Secondary | ICD-10-CM | POA: Insufficient documentation

## 2019-12-02 DIAGNOSIS — K2970 Gastritis, unspecified, without bleeding: Secondary | ICD-10-CM | POA: Insufficient documentation

## 2020-01-07 NOTE — Telephone Encounter (Signed)
error 

## 2020-01-29 ENCOUNTER — Ambulatory Visit: Payer: PPO | Admitting: Internal Medicine

## 2020-01-29 ENCOUNTER — Other Ambulatory Visit: Payer: Self-pay | Admitting: Gastroenterology

## 2020-03-02 ENCOUNTER — Ambulatory Visit (INDEPENDENT_AMBULATORY_CARE_PROVIDER_SITE_OTHER): Payer: PPO | Admitting: Internal Medicine

## 2020-03-02 ENCOUNTER — Encounter: Payer: Self-pay | Admitting: Internal Medicine

## 2020-03-02 ENCOUNTER — Other Ambulatory Visit: Payer: Self-pay

## 2020-03-02 VITALS — BP 138/90 | HR 52 | Temp 98.1°F | Wt 198.6 lb

## 2020-03-02 DIAGNOSIS — J310 Chronic rhinitis: Secondary | ICD-10-CM

## 2020-03-02 DIAGNOSIS — I10 Essential (primary) hypertension: Secondary | ICD-10-CM

## 2020-03-02 DIAGNOSIS — Z0000 Encounter for general adult medical examination without abnormal findings: Secondary | ICD-10-CM

## 2020-03-02 DIAGNOSIS — R109 Unspecified abdominal pain: Secondary | ICD-10-CM | POA: Diagnosis not present

## 2020-03-02 LAB — CBC WITH DIFFERENTIAL/PLATELET
Basophils Absolute: 0 10*3/uL (ref 0.0–0.1)
Basophils Relative: 0.4 % (ref 0.0–3.0)
Eosinophils Absolute: 0.1 10*3/uL (ref 0.0–0.7)
Eosinophils Relative: 1.8 % (ref 0.0–5.0)
HCT: 43.3 % (ref 39.0–52.0)
Hemoglobin: 14.6 g/dL (ref 13.0–17.0)
Lymphocytes Relative: 32.9 % (ref 12.0–46.0)
Lymphs Abs: 2.3 10*3/uL (ref 0.7–4.0)
MCHC: 33.8 g/dL (ref 30.0–36.0)
MCV: 88.4 fl (ref 78.0–100.0)
Monocytes Absolute: 0.6 10*3/uL (ref 0.1–1.0)
Monocytes Relative: 8.6 % (ref 3.0–12.0)
Neutro Abs: 3.9 10*3/uL (ref 1.4–7.7)
Neutrophils Relative %: 56.3 % (ref 43.0–77.0)
Platelets: 187 10*3/uL (ref 150.0–400.0)
RBC: 4.89 Mil/uL (ref 4.22–5.81)
RDW: 13.3 % (ref 11.5–15.5)
WBC: 7 10*3/uL (ref 4.0–10.5)

## 2020-03-02 LAB — URINALYSIS
Bilirubin Urine: NEGATIVE
Hgb urine dipstick: NEGATIVE
Ketones, ur: NEGATIVE
Leukocytes,Ua: NEGATIVE
Nitrite: NEGATIVE
Specific Gravity, Urine: 1.025 (ref 1.000–1.030)
Total Protein, Urine: NEGATIVE
Urine Glucose: NEGATIVE
Urobilinogen, UA: 0.2 (ref 0.0–1.0)
pH: 5.5 (ref 5.0–8.0)

## 2020-03-02 LAB — COMPREHENSIVE METABOLIC PANEL
ALT: 16 U/L (ref 0–53)
AST: 13 U/L (ref 0–37)
Albumin: 4.1 g/dL (ref 3.5–5.2)
Alkaline Phosphatase: 58 U/L (ref 39–117)
BUN: 24 mg/dL — ABNORMAL HIGH (ref 6–23)
CO2: 28 mEq/L (ref 19–32)
Calcium: 9.3 mg/dL (ref 8.4–10.5)
Chloride: 104 mEq/L (ref 96–112)
Creatinine, Ser: 1.17 mg/dL (ref 0.40–1.50)
GFR: 63.87 mL/min (ref >60.00)
Glucose, Bld: 96 mg/dL (ref 70–99)
Potassium: 4.4 mEq/L (ref 3.5–5.1)
Sodium: 138 mEq/L (ref 135–145)
Total Bilirubin: 0.4 mg/dL (ref 0.2–1.2)
Total Protein: 7.2 g/dL (ref 6.0–8.3)

## 2020-03-02 LAB — LIPID PANEL
Cholesterol: 211 mg/dL — ABNORMAL HIGH (ref 0–200)
HDL: 48.2 mg/dL (ref >39.00)
LDL Cholesterol: 138 mg/dL — ABNORMAL HIGH (ref 0–99)
NonHDL: 162.79
Total CHOL/HDL Ratio: 4
Triglycerides: 126 mg/dL (ref 0.0–149.0)
VLDL: 25.2 mg/dL (ref 0.0–40.0)

## 2020-03-02 LAB — PSA: PSA: 2.09 ng/mL (ref 0.10–4.00)

## 2020-03-02 LAB — TSH: TSH: 1.85 u[IU]/mL (ref 0.35–4.50)

## 2020-03-02 MED ORDER — IPRATROPIUM BROMIDE 0.06 % NA SOLN: 2.0000 | Freq: Three times a day (TID) | NASAL | 3 refills | Status: DC

## 2020-03-02 MED ORDER — LORATADINE 10 MG PO TABS
10.0000 mg | ORAL_TABLET | Freq: Every day | ORAL | 3 refills | Status: DC
Start: 2020-03-02 — End: 2021-04-06

## 2020-03-02 MED ORDER — FLUTICASONE PROPIONATE 50 MCG/ACT NA SUSP: 2.0000 | Freq: Every day | NASAL | 3 refills | Status: DC

## 2020-03-02 MED ORDER — OMEPRAZOLE 40 MG PO CPDR
DELAYED_RELEASE_CAPSULE | ORAL | 3 refills | Status: DC
Start: 2020-03-02 — End: 2020-03-02

## 2020-03-02 MED ORDER — LOSARTAN POTASSIUM 100 MG PO TABS: 100.0000 mg | ORAL_TABLET | Freq: Every day | ORAL | 1 refills | Status: DC

## 2020-03-02 MED ORDER — OMEPRAZOLE 40 MG PO CPDR: 40.0000 mg | DELAYED_RELEASE_CAPSULE | Freq: Every day | ORAL | 1 refills | Status: DC

## 2020-03-02 NOTE — Assessment & Plan Note (Addendum)
Flank pain Abd CT CBC, CMET, UA

## 2020-03-02 NOTE — Assessment & Plan Note (Signed)
Losartan 100 mg/d 

## 2020-03-02 NOTE — Progress Notes (Signed)
Subjective:  Patient ID: Brent Wright, male    DOB: 03-08-1950  Age: 70 y.o. MRN: [id number]  CC: Hypertension (F/u on BP)   HPI Brent Wright presents for HTN C/o L flank pain x weeks C/o runny nose when out in the cold weather  They moved to [place] in [place] in 2021  Outpatient Medications Prior to Visit  Medication Sig Dispense Refill  . Cholecalciferol (VITAMIN D3) 2000 units capsule Take 1 capsule (2,000 Units total) by mouth daily. 100 capsule 3  . latanoprost (XALATAN) 0.005 % ophthalmic solution INSTILL 1 DROP INTO BOTH EYES EVERY DAY AT NIGHT    . omeprazole (PRILOSEC) 40 MG capsule TAKE 1 CAPSULE(40 MG) BY MOUTH IN THE MORNING AND AT BEDTIME FOR 30 DAYS THEN TAKE 1 CAPSULE(40 MG) BY MOUTH DAILY 90 capsule 1  . losartan (COZAAR) 100 MG tablet Take 1 tablet (100 mg total) by mouth daily. 90 tablet 3   No facility-administered medications prior to visit.    ROS: Review of Systems  Constitutional: Negative for appetite change, fatigue and unexpected weight change.  HENT: Negative for congestion, nosebleeds, sneezing, sore throat and trouble swallowing.   Eyes: Negative for itching and visual disturbance.  Respiratory: Negative for cough.   Cardiovascular: Negative for chest pain, palpitations and leg swelling.  Gastrointestinal: Negative for abdominal distention, blood in stool, diarrhea and nausea.  Genitourinary: Negative for frequency and hematuria.  Musculoskeletal: Positive for back pain. Negative for gait problem, joint swelling and neck pain.  Skin: Negative for rash.  Neurological: Negative for dizziness, tremors, speech difficulty and weakness.  Psychiatric/Behavioral: Negative for agitation, dysphoric mood and sleep disturbance. The patient is not nervous/anxious.     Objective:  BP 138/90 (BP Location: Left Arm)   Pulse (!) 52   Temp 98.1 F (36.7 C) (Oral)   Wt 198 lb 9.6 oz (90.1 kg)   SpO2 98%   BMI 30.20 kg/m   BP Readings from Last 3 Encounters:   03/02/20 138/90  11/29/19 (!) 158/80  10/17/19 138/81    Wt Readings from Last 3 Encounters:  03/02/20 198 lb 9.6 oz (90.1 kg)  11/29/19 197 lb (89.4 kg)  10/17/19 194 lb (88 kg)    Physical Exam Constitutional:      General: He is not in acute distress.    Appearance: He is well-developed.     Comments: NAD  HENT:     Mouth/Throat:     Mouth: Oropharynx is clear and moist.  Eyes:     Conjunctiva/sclera: Conjunctivae normal.     Pupils: Pupils are equal, round, and reactive to light.  Neck:     Thyroid: No thyromegaly.     Vascular: No JVD.  Cardiovascular:     Rate and Rhythm: Normal rate and regular rhythm.     Pulses: Intact distal pulses.     Heart sounds: Normal heart sounds. No murmur heard. No friction rub. No gallop.   Pulmonary:     Effort: Pulmonary effort is normal. No respiratory distress.     Breath sounds: Normal breath sounds. No wheezing or rales.  Chest:     Chest wall: No tenderness.  Abdominal:     General: Bowel sounds are normal. There is no distension.     Palpations: Abdomen is soft. There is no mass.     Tenderness: There is no abdominal tenderness. There is no guarding or rebound.  Musculoskeletal:        General: Tenderness present. No edema. Normal range  of motion.     Cervical back: Normal range of motion.  Lymphadenopathy:     Cervical: No cervical adenopathy.  Skin:    General: Skin is warm and dry.     Findings: No rash.  Neurological:     Mental Status: He is alert and oriented to person, place, and time.     Cranial Nerves: No cranial nerve deficit.     Motor: No abnormal muscle tone.     Coordination: He displays a negative Romberg sign. Coordination normal.     Gait: Gait normal.     Deep Tendon Reflexes: Reflexes are normal and symmetric.  Psychiatric:        Mood and Affect: Mood and affect normal.        Behavior: Behavior normal.        Thought Content: Thought content normal.        Judgment: Judgment normal.   L  flank, abd pain w/dep palpation  Lab Results  Component Value Date   WBC 5.6 10/24/2018   HGB 15.1 10/24/2018   HCT 44.1 10/24/2018   PLT 203.0 10/24/2018   GLUCOSE 84 10/24/2018   CHOL 203 (H) 10/24/2018   TRIG 100.0 10/24/2018   HDL 43.40 10/24/2018   LDLCALC 139 (H) 10/24/2018   ALT 16 10/24/2018   AST 22 10/24/2018   NA 142 10/24/2018   K 4.3 10/24/2018   CL 107 10/24/2018   CREATININE 1.04 10/24/2018   BUN 17 10/24/2018   CO2 27 10/24/2018   TSH 1.47 10/24/2018   PSA 2.13 10/24/2018    No results found.  Assessment & Plan:   There are no diagnoses linked to this encounter.   Meds ordered this encounter  Medications  . losartan (COZAAR) 100 MG tablet    Sig: Take 1 tablet (100 mg total) by mouth daily.    Dispense:  90 tablet    Refill:  1     Follow-up: No follow-ups on file.  Walker Kehr, MD

## 2020-03-07 ENCOUNTER — Other Ambulatory Visit: Payer: Self-pay | Admitting: Internal Medicine

## 2020-03-07 MED ORDER — IRBESARTAN 150 MG PO TABS: 150.0000 mg | ORAL_TABLET | Freq: Every day | ORAL | 3 refills | Status: DC

## 2020-03-10 NOTE — Telephone Encounter (Signed)
error 

## 2020-03-11 ENCOUNTER — Ambulatory Visit (INDEPENDENT_AMBULATORY_CARE_PROVIDER_SITE_OTHER)
Admission: RE | Admit: 2020-03-11 | Discharge: 2020-03-11 | Disposition: A | Discharge status: Home / Self Care | Payer: PPO | Source: Ambulatory Visit | Attending: Internal Medicine | Admitting: Internal Medicine

## 2020-03-11 ENCOUNTER — Other Ambulatory Visit: Payer: Self-pay

## 2020-03-11 DIAGNOSIS — R1032 Left lower quadrant pain: Secondary | ICD-10-CM | POA: Diagnosis not present

## 2020-03-11 DIAGNOSIS — Z0000 Encounter for general adult medical examination without abnormal findings: Secondary | ICD-10-CM

## 2020-03-11 DIAGNOSIS — R109 Unspecified abdominal pain: Secondary | ICD-10-CM | POA: Diagnosis not present

## 2020-03-11 MED ORDER — IOHEXOL 300 MG/ML  SOLN
100.0000 mL | Freq: Once | INTRAMUSCULAR | Status: AC | PRN
  Administered 2020-03-11: 100 mL via INTRAVENOUS

## 2020-03-12 ENCOUNTER — Telehealth: Payer: Self-pay | Admitting: *Deleted

## 2020-03-12 NOTE — Telephone Encounter (Signed)
Rec'd call from [facility] call report- report is in epic..Brent Wright  1. No acute findings in the abdomen or pelvis. 2. There is an indeterminate heterogeneously enhancing small lesion in the superior pole of the right kidney measuring approximately 1.1 cm. Recommend renal mass protocol MRI (preferred) or CT abdomen pelvis with and without contrast for further evaluation.

## 2020-03-15 ENCOUNTER — Other Ambulatory Visit: Payer: Self-pay | Admitting: Internal Medicine

## 2020-03-15 DIAGNOSIS — N2889 Other specified disorders of kidney and ureter: Secondary | ICD-10-CM

## 2020-03-15 DIAGNOSIS — N281 Cyst of kidney, acquired: Secondary | ICD-10-CM

## 2020-03-15 NOTE — Telephone Encounter (Signed)
Lorre Nick, I have sent him a message via [place]. Thanks   Dear Alease Frame, Your abdominal CT scan reveals no acute process, i.e. infection. You have gallstones in your gallbladder without any signs of inflammation (no cholecystitis). Your prostate is enlarged. You have a small nodule in your right kidney 1.1 cm that will need further attention with a test called an MRI to make sure it is benign. I placed an order for this test they will get in touch with you. Sincerely, AP

## 2020-03-17 NOTE — Telephone Encounter (Signed)
Did not see mychart msg..so I resent msg../lmb

## 2020-03-30 ENCOUNTER — Encounter: Payer: Self-pay | Admitting: Gastroenterology

## 2020-04-12 ENCOUNTER — Ambulatory Visit
Admission: RE | Admit: 2020-04-12 | Discharge: 2020-04-12 | Disposition: A | Discharge status: Home / Self Care | Payer: PPO | Source: Ambulatory Visit | Attending: Internal Medicine | Admitting: Internal Medicine

## 2020-04-12 DIAGNOSIS — K7689 Other specified diseases of liver: Secondary | ICD-10-CM | POA: Diagnosis not present

## 2020-04-12 DIAGNOSIS — K8020 Calculus of gallbladder without cholecystitis without obstruction: Secondary | ICD-10-CM | POA: Diagnosis not present

## 2020-04-12 DIAGNOSIS — N281 Cyst of kidney, acquired: Secondary | ICD-10-CM

## 2020-04-12 DIAGNOSIS — N2889 Other specified disorders of kidney and ureter: Secondary | ICD-10-CM

## 2020-04-12 DIAGNOSIS — K862 Cyst of pancreas: Secondary | ICD-10-CM | POA: Diagnosis not present

## 2020-04-12 MED ORDER — GADOBENATE DIMEGLUMINE 529 MG/ML IV SOLN
18.0000 mL | Freq: Once | INTRAVENOUS | Status: AC | PRN
  Administered 2020-04-12: 18 mL via INTRAVENOUS

## 2020-04-23 DIAGNOSIS — H43813 Vitreous degeneration, bilateral: Secondary | ICD-10-CM | POA: Diagnosis not present

## 2020-04-23 DIAGNOSIS — H40023 Open angle with borderline findings, high risk, bilateral: Secondary | ICD-10-CM | POA: Diagnosis not present

## 2020-04-23 DIAGNOSIS — H2513 Age-related nuclear cataract, bilateral: Secondary | ICD-10-CM | POA: Diagnosis not present

## 2020-04-23 DIAGNOSIS — H25013 Cortical age-related cataract, bilateral: Secondary | ICD-10-CM | POA: Diagnosis not present

## 2020-04-23 DIAGNOSIS — H524 Presbyopia: Secondary | ICD-10-CM | POA: Diagnosis not present

## 2020-04-23 DIAGNOSIS — H5213 Myopia, bilateral: Secondary | ICD-10-CM | POA: Diagnosis not present

## 2020-04-23 DIAGNOSIS — H40053 Ocular hypertension, bilateral: Secondary | ICD-10-CM | POA: Diagnosis not present

## 2020-04-23 DIAGNOSIS — H52223 Regular astigmatism, bilateral: Secondary | ICD-10-CM | POA: Diagnosis not present

## 2020-04-23 DIAGNOSIS — D3131 Benign neoplasm of right choroid: Secondary | ICD-10-CM | POA: Diagnosis not present

## 2020-04-29 ENCOUNTER — Encounter: Payer: Self-pay | Admitting: Internal Medicine

## 2020-04-29 ENCOUNTER — Other Ambulatory Visit: Payer: Self-pay

## 2020-04-29 ENCOUNTER — Ambulatory Visit (INDEPENDENT_AMBULATORY_CARE_PROVIDER_SITE_OTHER): Payer: PPO | Admitting: Internal Medicine

## 2020-04-29 DIAGNOSIS — I700 Atherosclerosis of aorta: Secondary | ICD-10-CM

## 2020-04-29 DIAGNOSIS — K219 Gastro-esophageal reflux disease without esophagitis: Secondary | ICD-10-CM | POA: Diagnosis not present

## 2020-04-29 DIAGNOSIS — K862 Cyst of pancreas: Secondary | ICD-10-CM | POA: Diagnosis not present

## 2020-04-29 DIAGNOSIS — K8020 Calculus of gallbladder without cholecystitis without obstruction: Secondary | ICD-10-CM | POA: Diagnosis not present

## 2020-04-29 DIAGNOSIS — N281 Cyst of kidney, acquired: Secondary | ICD-10-CM

## 2020-04-29 DIAGNOSIS — I10 Essential (primary) hypertension: Secondary | ICD-10-CM | POA: Diagnosis not present

## 2020-04-29 MED ORDER — LATANOPROST 0.005 % OP SOLN: 1.0000 [drp] | Freq: Every day | OPHTHALMIC | 0 refills | Status: DC

## 2020-04-29 MED ORDER — PANTOPRAZOLE SODIUM 40 MG PO TBEC: 40.0000 mg | DELAYED_RELEASE_TABLET | Freq: Every day | ORAL | 3 refills | Status: DC

## 2020-04-29 MED ORDER — LOSARTAN POTASSIUM 100 MG PO TABS: 100.0000 mg | ORAL_TABLET | Freq: Every day | ORAL | 3 refills | Status: DC

## 2020-04-29 NOTE — Assessment & Plan Note (Signed)
Start Protonix.  Discontinue omeprazole -the patient has concerns about potential side effects of omeprazole

## 2020-04-29 NOTE — Assessment & Plan Note (Signed)
Asymptomatic.    Abdominal MRI IMPRESSION: 1. No suspicious renal masses. Numerous simple renal cysts bilaterally, see comments. 2. A few scattered tiny nonaggressive nonenhancing cystic pancreatic lesions, largest 0.5 cm in the pancreatic tail. No pancreatic duct dilation. Follow-up MRI abdomen without and with IV contrast is recommended in two years. This recommendation follows ACR consensus guidelines: Management of Incidental Pancreatic Cysts: A White Paper of the ACR Incidental Findings Committee. [place] 2761;[id number]. 3. Cholelithiasis. No biliary ductal dilatation. No choledocholithiasis. Electronically Signed   By: Ilona Sorrel M.D.   On: 04/12/2020 15:02

## 2020-04-29 NOTE — Progress Notes (Signed)
Subjective:  Patient ID: Brent Wright, male    DOB: 08-10-1950  Age: 70 y.o. MRN: [id number]  CC: Follow-up (2 month f/u)   HPI Mickel Schreur presents for HTN, GERD, HTN abn CT/MRI  Outpatient Medications Prior to Visit  Medication Sig Dispense Refill  . Cholecalciferol (VITAMIN D3) 2000 units capsule Take 1 capsule (2,000 Units total) by mouth daily. 100 capsule 3  . fluticasone (FLONASE) 50 MCG/ACT nasal spray Place 2 sprays into both nostrils daily. 48 g 3  . ipratropium (ATROVENT) 0.06 % nasal spray Place 2 sprays into the nose 3 (three) times daily. 45 mL 3  . irbesartan (AVAPRO) 150 MG tablet Take 1 tablet (150 mg total) by mouth daily. 90 tablet 3  . latanoprost (XALATAN) 0.005 % ophthalmic solution INSTILL 1 DROP INTO BOTH EYES EVERY DAY AT NIGHT    . loratadine (CLARITIN) 10 MG tablet Take 1 tablet (10 mg total) by mouth daily. 100 tablet 3  . omeprazole (PRILOSEC) 40 MG capsule Take 1 capsule (40 mg total) by mouth daily. It is 6 months supply due to travel 180 capsule 1   No facility-administered medications prior to visit.    ROS: Review of Systems  Constitutional: Negative for appetite change, fatigue and unexpected weight change.  HENT: Negative for congestion, nosebleeds, sneezing, sore throat and trouble swallowing.   Eyes: Negative for itching and visual disturbance.  Respiratory: Negative for cough.   Cardiovascular: Negative for chest pain, palpitations and leg swelling.  Gastrointestinal: Negative for abdominal distention, blood in stool, diarrhea and nausea.  Genitourinary: Negative for frequency and hematuria.  Musculoskeletal: Negative for back pain, gait problem, joint swelling and neck pain.  Skin: Negative for rash.  Neurological: Negative for dizziness, tremors, speech difficulty and weakness.  Psychiatric/Behavioral: Negative for agitation, dysphoric mood, sleep disturbance and suicidal ideas. The patient is not nervous/anxious.     Objective:  BP  140/80 (BP Location: Left Arm)   Pulse (!) 56   Temp 98.4 F (36.9 C) (Oral)   Ht 5\' 8"  (1.727 m)   Wt 193 lb 3.2 oz (87.6 kg)   SpO2 97%   BMI 29.38 kg/m   BP Readings from Last 3 Encounters:  04/29/20 140/80  03/02/20 138/90  11/29/19 (!) 158/80    Wt Readings from Last 3 Encounters:  04/29/20 193 lb 3.2 oz (87.6 kg)  03/02/20 198 lb 9.6 oz (90.1 kg)  11/29/19 197 lb (89.4 kg)    Physical Exam Constitutional:      General: He is not in acute distress.    Appearance: He is well-developed.     Comments: NAD  Eyes:     Conjunctiva/sclera: Conjunctivae normal.     Pupils: Pupils are equal, round, and reactive to light.  Neck:     Thyroid: No thyromegaly.     Vascular: No JVD.  Cardiovascular:     Rate and Rhythm: Normal rate and regular rhythm.     Heart sounds: Normal heart sounds. No murmur heard. No friction rub. No gallop.   Pulmonary:     Effort: Pulmonary effort is normal. No respiratory distress.     Breath sounds: Normal breath sounds. No wheezing or rales.  Chest:     Chest wall: No tenderness.  Abdominal:     General: Bowel sounds are normal. There is no distension.     Palpations: Abdomen is soft. There is no mass.     Tenderness: There is no abdominal tenderness. There is no guarding or rebound.  Musculoskeletal:        General: No tenderness. Normal range of motion.     Cervical back: Normal range of motion.  Lymphadenopathy:     Cervical: No cervical adenopathy.  Skin:    General: Skin is warm and dry.     Findings: No rash.  Neurological:     Mental Status: He is alert and oriented to person, place, and time.     Cranial Nerves: No cranial nerve deficit.     Motor: No abnormal muscle tone.     Coordination: Coordination normal.     Gait: Gait normal.     Deep Tendon Reflexes: Reflexes are normal and symmetric.  Psychiatric:        Behavior: Behavior normal.        Thought Content: Thought content normal.        Judgment: Judgment normal.      Lab Results  Component Value Date   WBC 7.0 03/02/2020   HGB 14.6 03/02/2020   HCT 43.3 03/02/2020   PLT 187.0 03/02/2020   GLUCOSE 96 03/02/2020   CHOL 211 (H) 03/02/2020   TRIG 126.0 03/02/2020   HDL 48.20 03/02/2020   LDLCALC 138 (H) 03/02/2020   ALT 16 03/02/2020   AST 13 03/02/2020   NA 138 03/02/2020   K 4.4 03/02/2020   CL 104 03/02/2020   CREATININE 1.17 03/02/2020   BUN 24 (H) 03/02/2020   CO2 28 03/02/2020   TSH 1.85 03/02/2020   PSA 2.09 03/02/2020    MR ABDOMEN W WO CONTRAST  Result Date: 04/12/2020 CLINICAL DATA:  Small indeterminate upper right renal lesion on recent CT abdomen/pelvis study. EXAM: MRI ABDOMEN WITHOUT AND WITH CONTRAST TECHNIQUE: Multiplanar multisequence MR imaging of the abdomen was performed both before and after the administration of intravenous contrast. CONTRAST:  81mL MULTIHANCE GADOBENATE DIMEGLUMINE 529 MG/ML IV SOLN COMPARISON:  03/11/2020 CT abdomen/pelvis. FINDINGS: Lower chest: No acute abnormality at the lung bases. Hepatobiliary: Normal liver size and configuration. No hepatic steatosis. Subcentimeter simple liver cyst in the segment 2 left liver. No suspicious liver masses. There is a 3.1 cm gallstone layering in the nondistended gallbladder with no gallbladder wall thickening or pericholecystic fluid. No biliary ductal dilatation. Common bile duct diameter 5 mm. No choledocholithiasis. No biliary masses, strictures or beading. Pancreas: A few (at least 4) tiny unilocular cystic pancreatic lesions scattered throughout the pancreatic body and tail, largest 0.5 cm in the pancreatic tail (series 7/image 14), none of which demonstrate wall thickening or solid enhancement. No pancreatic duct dilation. No pancreas divisum. Spleen: Normal size. No mass. Adrenals/Urinary Tract: Normal adrenals. No hydronephrosis. Numerous simple renal cortical cysts scattered throughout both kidneys, largest 5.3 x 4.1 cm in the interpolar right kidney. The  indeterminate hypodense lateral upper right renal cortical lesion described on the 03/11/2020 CT abdomen/pelvis study is seen to represent a simple 1.1 cm partially exophytic renal cortical cyst on today's MRI (series 4/image 9). No suspicious renal masses. Stomach/Bowel: Normal non-distended stomach. Visualized small and large bowel is normal caliber, with no bowel wall thickening. Vascular/Lymphatic: Normal caliber abdominal aorta. Patent portal, splenic, hepatic and renal veins. No pathologically enlarged lymph nodes in the abdomen. Other: No abdominal ascites or focal fluid collection. Musculoskeletal: No aggressive appearing focal osseous lesions. IMPRESSION: 1. No suspicious renal masses. Numerous simple renal cysts bilaterally, see comments. 2. A few scattered tiny nonaggressive nonenhancing cystic pancreatic lesions, largest 0.5 cm in the pancreatic tail. No pancreatic duct dilation. Follow-up MRI abdomen without  and with IV contrast is recommended in two years. This recommendation follows ACR consensus guidelines: Management of Incidental Pancreatic Cysts: A White Paper of the ACR Incidental Findings Committee. [place] 7955;83:167-425. 3. Cholelithiasis. No biliary ductal dilatation. No choledocholithiasis. Electronically Signed   By: Ilona Sorrel M.D.   On: 04/12/2020 15:02    Assessment & Plan:     Follow-up: No follow-ups on file.  Walker Kehr, MD

## 2020-04-29 NOTE — Assessment & Plan Note (Signed)
Abdominal MRI IMPRESSION: 1. No suspicious renal masses. Numerous simple renal cysts bilaterally, see comments. 2. A few scattered tiny nonaggressive nonenhancing cystic pancreatic lesions, largest 0.5 cm in the pancreatic tail. No pancreatic duct dilation. Follow-up MRI abdomen without and with IV contrast is recommended in two years. This recommendation follows ACR consensus guidelines: Management of Incidental Pancreatic Cysts: A White Paper of the ACR Incidental Findings Committee. [place] 6484;72:072-182. 3. Cholelithiasis. No biliary ductal dilatation. No choledocholithiasis. Electronically Signed   By: Ilona Sorrel M.D.   On: 04/12/2020 15:02 Discussed.  Repeat MRI in 2 years

## 2020-04-29 NOTE — Assessment & Plan Note (Addendum)
MRI was OK.  Discussed with patient Brent Wright: 1. No suspicious renal masses. Numerous simple renal cysts bilaterally, see comments. 2. A few scattered tiny nonaggressive nonenhancing cystic pancreatic lesions, largest 0.5 cm in the pancreatic tail. No pancreatic duct dilation. Follow-up MRI abdomen without and with IV contrast is recommended in two years. This recommendation follows ACR consensus guidelines: Management of Incidental Pancreatic Cysts: A White Paper of the ACR Incidental Findings Committee. [place] 4585;92:924-462. 3. Cholelithiasis. No biliary ductal dilatation. No choledocholithiasis. Electronically Signed   By: Ilona Sorrel M.D.   On: 04/12/2020 15:02

## 2020-04-29 NOTE — Assessment & Plan Note (Signed)
Change back to Losartan 100 mg/d

## 2020-05-25 DIAGNOSIS — I700 Atherosclerosis of aorta: Secondary | ICD-10-CM | POA: Insufficient documentation

## 2020-05-25 NOTE — Assessment & Plan Note (Signed)
Diet, exercise

## 2020-07-27 ENCOUNTER — Other Ambulatory Visit: Payer: Self-pay | Admitting: Internal Medicine

## 2020-10-11 ENCOUNTER — Other Ambulatory Visit: Payer: Self-pay | Admitting: Internal Medicine

## 2021-04-06 ENCOUNTER — Other Ambulatory Visit: Payer: Self-pay | Admitting: Internal Medicine

## 2021-06-06 IMAGING — MR MR ABDOMEN WO/W CM
17 series · 48 of 48 positions shown · IV contrast (18ml multihance)
Comparison: 03/11/2020 CT abdomen/pelvis.

CLINICAL DATA: Small indeterminate upper right renal lesion on
recent CT abdomen/pelvis study.

EXAM:
MRI ABDOMEN WITHOUT AND WITH CONTRAST
TECHNIQUE: Multiplanar multisequence MR imaging of the abdomen was performed
both before and after the administration of intravenous contrast.
CONTRAST:  18mL MULTIHANCE GADOBENATE DIMEGLUMINE 529 MG/ML IV SOLN

[Series 3: T2 · coronal · 5.0mm · 0.78mm/px · 1 of 23 slices shown (1 of 3)]
[im 1/23]
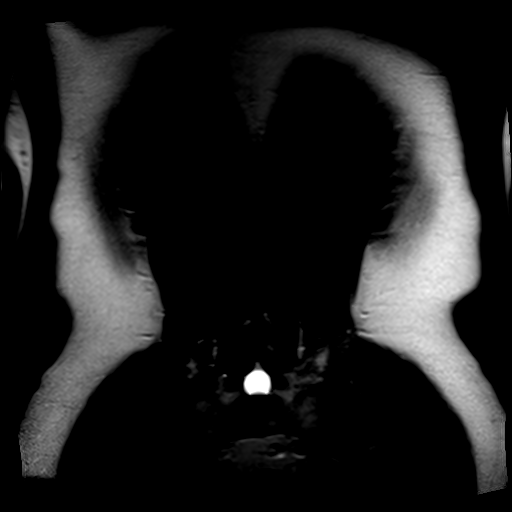

[Series 4: T2 · axial · 5.0mm · 0.74mm/px · 1 of 30 slices shown (2 of 3)]
[im 1/30]
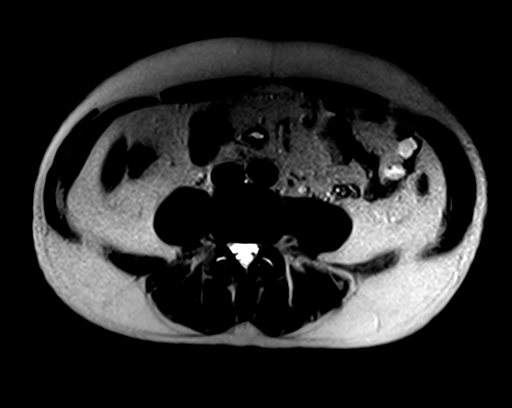

[Series 5: ep2d_diff_b50_500_800_p2_trig · axial · 6.0mm · 1.98mm/px · z∈[-117,+78]mm · 2 of 78 slices shown]
[im 1/78]
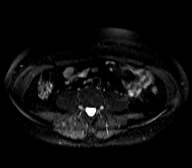
[im 78/78]
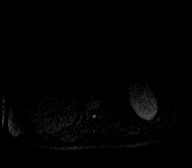

[Series 6: ep2d_[id number]_500_800_p2_trig_adc · axial · 6.0mm · 1.98mm/px · 1 of 26 slices shown]
[im 1/26]
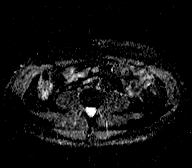

[Series 7: T2 · axial · 5.0mm · 1.48mm/px · 1 of 33 slices shown (3 of 3)]
[im 1/33]
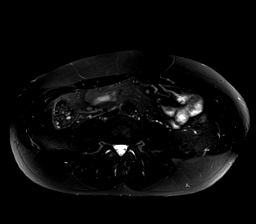

[Series 8: bSSFP · axial · 4.0mm · 0.74mm/px · 1 of 43 slices shown]
[im 1/43]
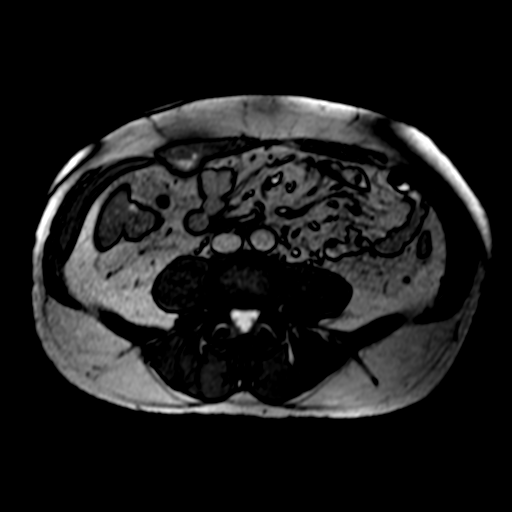

[Series 9: T1 · axial · 5.0mm · 0.74mm/px · z∈[-140,+45]mm · 2 of 64 slices shown]
[im 1/64]
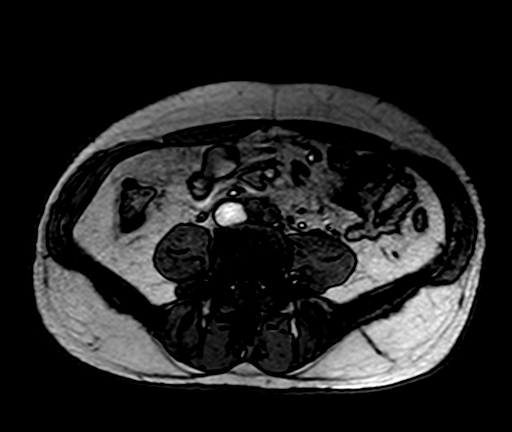
[im 64/64]
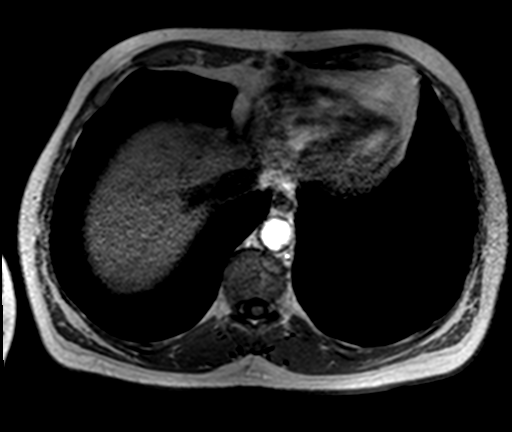

[Series 10: T1 dynamic · axial · non-contrast · 2.3mm · 1.48mm/px · z∈[-140,+42]mm · 4 of 80 slices shown]
[im 1/80]
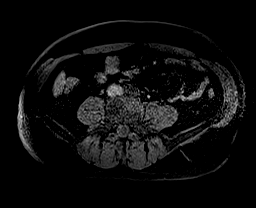
[im 27/80]
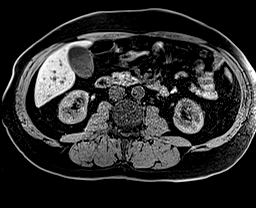
[im 53/80]
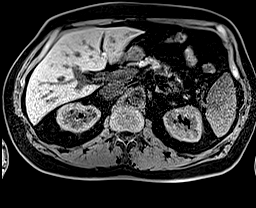
[im 80/80]
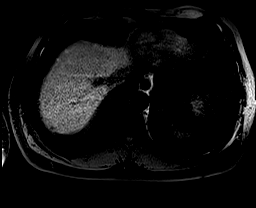

[Series 11: post 25 sec · axial · 2.3mm · 1.48mm/px · z∈[-140,+42]mm · 4 of 80 slices shown]
[im 1/80]
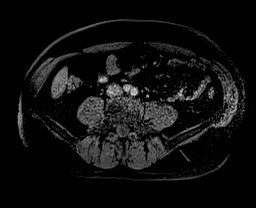
[im 27/80]
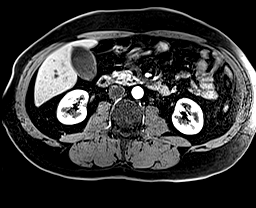
[im 53/80]
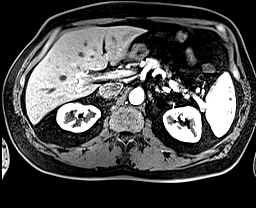
[im 80/80]
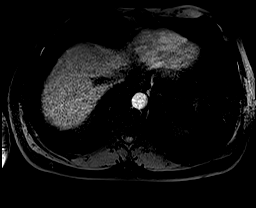

[Series 12: post 25 sec_sub · axial · 2.3mm · 1.48mm/px · z∈[-140,+42]mm · 4 of 80 slices shown]
[im 1/80]
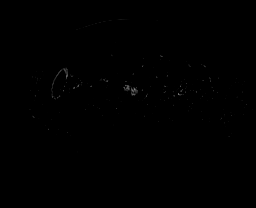
[im 27/80]
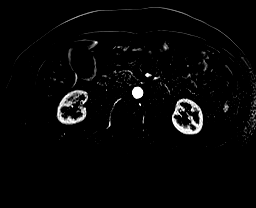
[im 53/80]
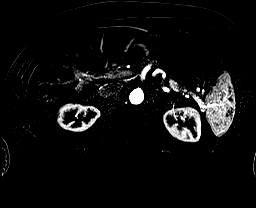
[im 80/80]
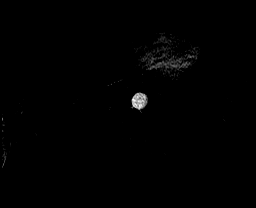

[Series 13: post 45 sec · axial · 2.3mm · 1.48mm/px · z∈[-140,+42]mm · 4 of 80 slices shown]
[im 1/80]
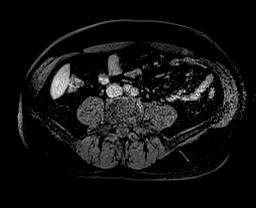
[im 27/80]
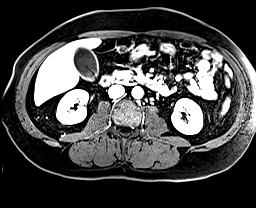
[im 53/80]
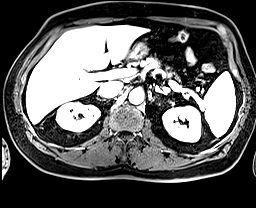
[im 80/80]
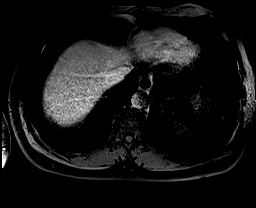

[Series 14: post 45 sec_sub · axial · 2.3mm · 1.48mm/px · z∈[-140,+42]mm · 4 of 80 slices shown]
[im 1/80]
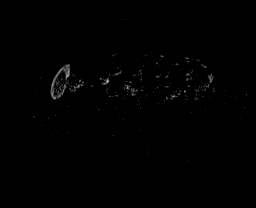
[im 27/80]
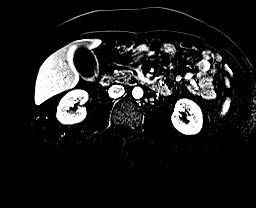
[im 53/80]
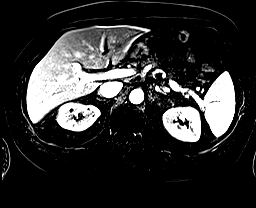
[im 80/80]
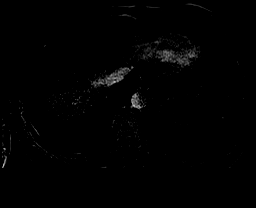

[Series 15: post 90 sec · axial · 2.3mm · 1.48mm/px · z∈[-140,+42]mm · 4 of 80 slices shown]
[im 1/80]
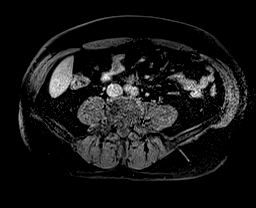
[im 27/80]
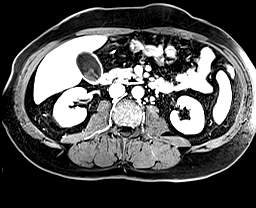
[im 53/80]
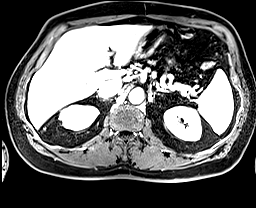
[im 80/80]
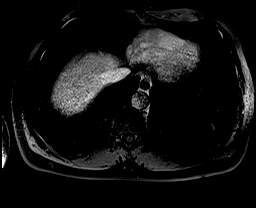

[Series 16: post 90 sec_sub · axial · 2.3mm · 1.48mm/px · z∈[-140,+42]mm · 4 of 80 slices shown]
[im 1/80]
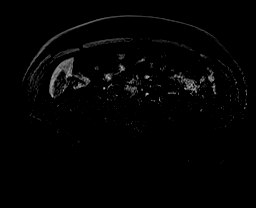
[im 27/80]
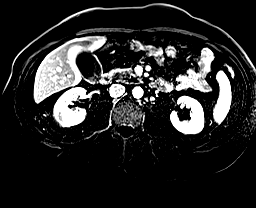
[im 53/80]
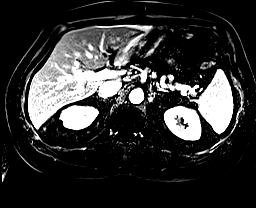
[im 80/80]
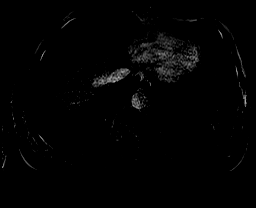

[Series 17: T1 dynamic post-contrast · coronal · 2.6mm · 0.78mm/px · 3 of 60 slices shown]
[im 1/60]
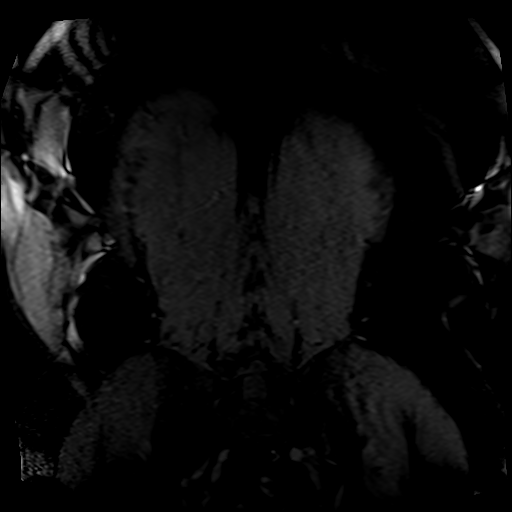
[im 30/60]
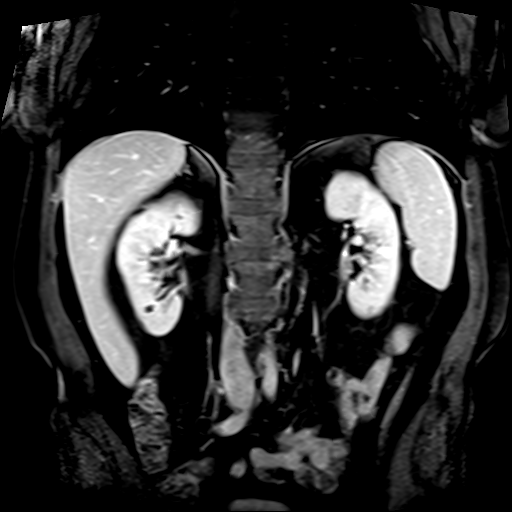
[im 60/60]
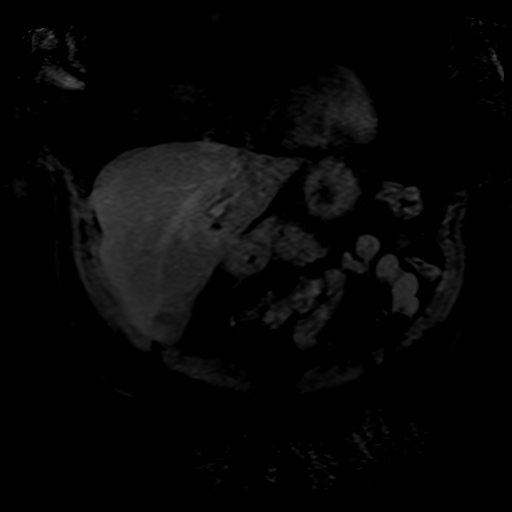

[Series 18: post axial 3+ · axial · 2.3mm · 1.48mm/px · z∈[-140,+42]mm · 4 of 80 slices shown (1 of 2)]
[im 1/80]
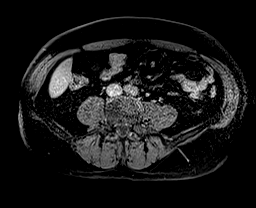
[im 27/80]
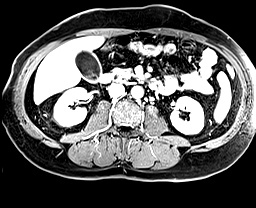
[im 53/80]
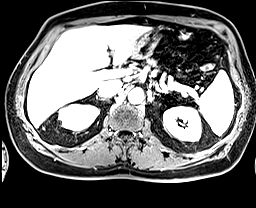
[im 80/80]
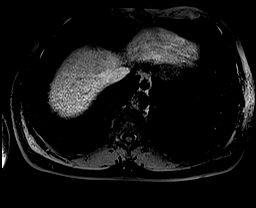

[Series 19: post axial 3+ · axial · 2.3mm · 1.48mm/px · z∈[-140,+42]mm · 4 of 80 slices shown (2 of 2)]
[im 1/80]
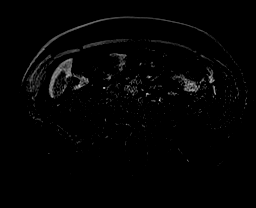
[im 27/80]
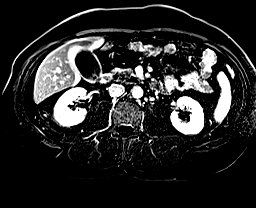
[im 53/80]
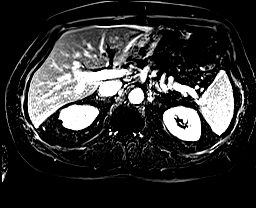
[im 80/80]
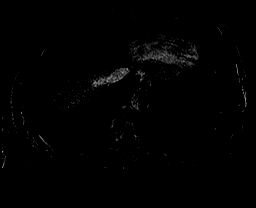

[48 of 48 positions shown; findings below may reference images not displayed]

FINDINGS: Lower chest: No acute abnormality at the lung bases.

Hepatobiliary: Normal liver size and configuration. No hepatic
steatosis. Subcentimeter simple liver cyst in the segment 2 left
liver. No suspicious liver masses. There is a 3.1 cm gallstone
layering in the nondistended gallbladder with no gallbladder wall
thickening or pericholecystic fluid. No biliary ductal dilatation.
Common bile duct diameter 5 mm. No choledocholithiasis. No biliary
masses, strictures or beading.

Pancreas: A few (at least 4) tiny unilocular cystic pancreatic
lesions scattered throughout the pancreatic body and tail, largest
0.5 cm in the pancreatic tail (series 7/image 14), none of which
demonstrate wall thickening or solid enhancement. No pancreatic duct
dilation. No pancreas divisum.

Spleen: Normal size. No mass.

Adrenals/Urinary Tract: Normal adrenals. No hydronephrosis. Numerous
simple renal cortical cysts scattered throughout both kidneys,
largest 5.3 x 4.1 cm in the interpolar right kidney. The
indeterminate hypodense lateral upper right renal cortical lesion
described on the 03/11/2020 CT abdomen/pelvis study is seen to
represent a simple 1.1 cm partially exophytic renal cortical cyst on
today's MRI (series 4/image 9). No suspicious renal masses.

Stomach/Bowel: Normal non-distended stomach. Visualized small and
large bowel is normal caliber, with no bowel wall thickening.

Vascular/Lymphatic: Normal caliber abdominal aorta. Patent portal,
splenic, hepatic and renal veins. No pathologically enlarged lymph
nodes in the abdomen.

Other: No abdominal ascites or focal fluid collection.

Musculoskeletal: No aggressive appearing focal osseous lesions.
IMPRESSION: 1. No suspicious renal masses. Numerous simple renal cysts
bilaterally, see comments.
2. A few scattered tiny nonaggressive nonenhancing cystic pancreatic
lesions, largest 0.5 cm in the pancreatic tail. No pancreatic duct
dilation. Follow-up MRI abdomen without and with IV contrast is
recommended in two years. This recommendation follows ACR consensus
guidelines: Management of Incidental Pancreatic Cysts: A White Paper
of the ACR Incidental Findings Committee. [HOSPITAL]
3. Cholelithiasis. No biliary ductal dilatation. No
choledocholithiasis.

## 2022-08-08 ENCOUNTER — Telehealth: Payer: Self-pay | Admitting: *Deleted

## 2022-08-08 NOTE — Telephone Encounter (Signed)
I attempted to contact patient by telephone but was unsuccessful. According to the patient's chart they are due for physical with  [facility]. I have left a HIPAA compliant message advising the patient to contact [facility] at [phone]. I will continue to follow up with the patient to make sure this appointment is scheduled.

## 2022-08-11 NOTE — Telephone Encounter (Signed)
I attempted to contact patient by telephone but was unsuccessful. According to the patient's chart they are due for physical with  [facility]. I have left a HIPAA compliant message advising the patient to contact [facility] at [phone]. I will continue to follow up with the patient to make sure this appointment is scheduled.

## 2023-09-27 ENCOUNTER — Encounter: Payer: Self-pay | Admitting: Internal Medicine

## 2023-10-05 NOTE — Telephone Encounter (Signed)
 New Patient Request for Dr. Garald  Copied from CRM [id number]. Topic: Appointments - Scheduling Inquiry for Clinic >> Oct 05, 2023  2:04 PM Thersia BROCKS wrote: Reason for CRM: Patient son called in regarding needing an annual physical scheduled with Dr.Plotnikov for patient, patient hasn't been in awhile and will be consider a new patient , son wanted to know if there is anyway Dr.Plotnikov could reconsider and see patient again, would like a callback on the decision [phone]   ----------------------------------------------------------------------- From previous Reason for Contact - Scheduling: Patient/patient representative is calling to schedule an appointment. Refer to attachments for appointment information.

## 2023-10-06 ENCOUNTER — Other Ambulatory Visit: Payer: Self-pay

## 2023-10-06 DIAGNOSIS — Z1211 Encounter for screening for malignant neoplasm of colon: Secondary | ICD-10-CM

## 2023-10-11 ENCOUNTER — Encounter: Payer: Self-pay | Admitting: Gastroenterology

## 2023-10-16 ENCOUNTER — Encounter: Payer: Self-pay | Admitting: Gastroenterology

## 2023-10-25 ENCOUNTER — Telehealth: Payer: Self-pay | Admitting: Internal Medicine

## 2023-10-25 DIAGNOSIS — Z0000 Encounter for general adult medical examination without abnormal findings: Secondary | ICD-10-CM

## 2023-10-25 NOTE — Telephone Encounter (Signed)
 Pt stated he have a CPE coming up and wanted to know can he come in tomorrow or week before appt to get labs done earlier if possible or do you recommend wait until after the appt? Please advise, Thanks

## 2023-10-27 NOTE — Telephone Encounter (Signed)
 Informed patient's spouse via MyChart of orders being placed

## 2023-10-27 NOTE — Telephone Encounter (Signed)
 Yes, thank you.

## 2023-10-30 ENCOUNTER — Other Ambulatory Visit (INDEPENDENT_AMBULATORY_CARE_PROVIDER_SITE_OTHER)

## 2023-10-30 DIAGNOSIS — Z0000 Encounter for general adult medical examination without abnormal findings: Secondary | ICD-10-CM | POA: Diagnosis not present

## 2023-10-30 LAB — CBC WITH DIFFERENTIAL/PLATELET
Basophils Absolute: 0 K/uL (ref 0.0–0.1)
Basophils Relative: 0.6 % (ref 0.0–3.0)
Eosinophils Absolute: 0.2 K/uL (ref 0.0–0.7)
Eosinophils Relative: 4.2 % (ref 0.0–5.0)
HCT: 43.4 % (ref 39.0–52.0)
Hemoglobin: 14.7 g/dL (ref 13.0–17.0)
Lymphocytes Relative: 32.9 % (ref 12.0–46.0)
Lymphs Abs: 1.8 K/uL (ref 0.7–4.0)
MCHC: 33.9 g/dL (ref 30.0–36.0)
MCV: 90.1 fl (ref 78.0–100.0)
Monocytes Absolute: 0.5 K/uL (ref 0.1–1.0)
Monocytes Relative: 9.6 % (ref 3.0–12.0)
Neutro Abs: 2.9 K/uL (ref 1.4–7.7)
Neutrophils Relative %: 52.7 % (ref 43.0–77.0)
Platelets: 199 K/uL (ref 150.0–400.0)
RBC: 4.82 Mil/uL (ref 4.22–5.81)
RDW: 12.7 % (ref 11.5–15.5)
WBC: 5.5 K/uL (ref 4.0–10.5)

## 2023-10-30 LAB — COMPREHENSIVE METABOLIC PANEL WITH GFR
ALT: 13 U/L (ref 0–53)
AST: 16 U/L (ref 0–37)
Albumin: 4.3 g/dL (ref 3.5–5.2)
Alkaline Phosphatase: 54 U/L (ref 39–117)
BUN: 18 mg/dL (ref 6–23)
CO2: 25 meq/L (ref 19–32)
Calcium: 9.1 mg/dL (ref 8.4–10.5)
Chloride: 108 meq/L (ref 96–112)
Creatinine, Ser: 1.21 mg/dL (ref 0.40–1.50)
GFR: 59.79 mL/min — ABNORMAL LOW (ref >60.00)
Glucose, Bld: 144 mg/dL — ABNORMAL HIGH (ref 70–99)
Potassium: 4.5 meq/L (ref 3.5–5.1)
Sodium: 141 meq/L (ref 135–145)
Total Bilirubin: 0.6 mg/dL (ref 0.2–1.2)
Total Protein: 6.9 g/dL (ref 6.0–8.3)

## 2023-10-30 LAB — URINALYSIS
Bilirubin Urine: NEGATIVE
Hgb urine dipstick: NEGATIVE
Ketones, ur: NEGATIVE
Leukocytes,Ua: NEGATIVE
Nitrite: NEGATIVE
Specific Gravity, Urine: 1.025 (ref 1.000–1.030)
Total Protein, Urine: NEGATIVE
Urine Glucose: NEGATIVE
Urobilinogen, UA: 0.2 (ref 0.0–1.0)
pH: 6 (ref 5.0–8.0)

## 2023-10-30 LAB — LIPID PANEL
Cholesterol: 189 mg/dL (ref 0–200)
HDL: 38.8 mg/dL — ABNORMAL LOW (ref >39.00)
LDL Cholesterol: 127 mg/dL — ABNORMAL HIGH (ref 0–99)
NonHDL: 150.46
Total CHOL/HDL Ratio: 5
Triglycerides: 118 mg/dL (ref 0.0–149.0)
VLDL: 23.6 mg/dL (ref 0.0–40.0)

## 2023-10-30 LAB — PSA: PSA: 2.31 ng/mL (ref 0.10–4.00)

## 2023-10-30 LAB — TSH: TSH: 1.16 u[IU]/mL (ref 0.35–5.50)

## 2023-10-31 ENCOUNTER — Ambulatory Visit (INDEPENDENT_AMBULATORY_CARE_PROVIDER_SITE_OTHER): Admitting: Internal Medicine

## 2023-10-31 ENCOUNTER — Encounter: Payer: Self-pay | Admitting: Internal Medicine

## 2023-10-31 VITALS — BP 138/84 | HR 68 | Temp 98.2°F | Ht 68.0 in | Wt 205.0 lb

## 2023-10-31 DIAGNOSIS — N529 Male erectile dysfunction, unspecified: Secondary | ICD-10-CM | POA: Diagnosis not present

## 2023-10-31 DIAGNOSIS — R739 Hyperglycemia, unspecified: Secondary | ICD-10-CM | POA: Diagnosis not present

## 2023-10-31 DIAGNOSIS — I10 Essential (primary) hypertension: Secondary | ICD-10-CM | POA: Diagnosis not present

## 2023-10-31 DIAGNOSIS — N2889 Other specified disorders of kidney and ureter: Secondary | ICD-10-CM

## 2023-10-31 DIAGNOSIS — Z0000 Encounter for general adult medical examination without abnormal findings: Secondary | ICD-10-CM

## 2023-10-31 DIAGNOSIS — K862 Cyst of pancreas: Secondary | ICD-10-CM

## 2023-10-31 MED ORDER — LOSARTAN POTASSIUM 100 MG PO TABS
100.0000 mg | ORAL_TABLET | Freq: Every day | ORAL | 3 refills | Status: AC
Start: 2023-10-31 — End: ?

## 2023-10-31 MED ORDER — PANTOPRAZOLE SODIUM 40 MG PO TBEC: 40.0000 mg | DELAYED_RELEASE_TABLET | Freq: Every day | ORAL | 3 refills | Status: AC

## 2023-10-31 NOTE — Assessment & Plan Note (Signed)
Dexcom G7

## 2023-10-31 NOTE — Assessment & Plan Note (Signed)
Change back to Losartan 100 mg/d

## 2023-10-31 NOTE — Progress Notes (Unsigned)
 Subjective:  Patient ID: Brent Wright, male    DOB: Dec 30, 1950  Age: 73 y.o. MRN: [id number]  CC: Annual Exam (Annual Exam)   HPI Brent Wright presents for  a well exam C/o R knee contusion MRI (-)  Outpatient Medications Prior to Visit  Medication Sig Dispense Refill  . Cholecalciferol (VITAMIN D3) 2000 units capsule Take 1 capsule (2,000 Units total) by mouth daily. 100 capsule 3  . latanoprost  (XALATAN ) 0.005 % ophthalmic solution INSTILL 1 DROP INTO BOTH EYES AT BEDTIME 7.5 mL 2  . losartan  (COZAAR ) 100 MG tablet Take 1 tablet (100 mg total) by mouth daily. It is 6 months due to travel 180 tablet 3  . pantoprazole  (PROTONIX ) 40 MG tablet Take 1 tablet (40 mg total) by mouth daily. 90 tablet 3  . fluticasone  (FLONASE ) 50 MCG/ACT nasal spray Place 2 sprays into both nostrils daily. 48 g 3  . ipratropium (ATROVENT ) 0.06 % nasal spray Place 2 sprays into the nose 3 (three) times daily. 45 mL 3  . loratadine  (CLARITIN ) 10 MG tablet TAKE 1 TABLET BY MOUTH EVERY DAY 100 tablet 3   No facility-administered medications prior to visit.    ROS: Review of Systems  Constitutional:  Negative for appetite change, fatigue and unexpected weight change.  HENT:  Negative for congestion, nosebleeds, sneezing, sore throat and trouble swallowing.   Eyes:  Negative for itching and visual disturbance.  Respiratory:  Negative for cough.   Cardiovascular:  Negative for chest pain, palpitations and leg swelling.  Gastrointestinal:  Negative for abdominal distention, blood in stool, diarrhea and nausea.  Genitourinary:  Negative for frequency and hematuria.  Musculoskeletal:  Negative for back pain, gait problem, joint swelling and neck pain.  Skin:  Negative for rash.  Neurological:  Negative for dizziness, tremors, speech difficulty and weakness.  Psychiatric/Behavioral:  Negative for agitation, dysphoric mood and sleep disturbance. The patient is not nervous/anxious.     Objective:  BP 138/84    Pulse 68   Temp 98.2 F (36.8 C)   Ht 5' 8 (1.727 m)   Wt 205 lb (93 kg)   SpO2 96%   BMI 31.17 kg/m   BP Readings from Last 3 Encounters:  10/31/23 138/84  04/29/20 140/80  03/02/20 138/90    Wt Readings from Last 3 Encounters:  10/31/23 205 lb (93 kg)  04/29/20 193 lb 3.2 oz (87.6 kg)  03/02/20 198 lb 9.6 oz (90.1 kg)    Physical Exam Constitutional:      General: He is not in acute distress.    Appearance: He is well-developed.     Comments: NAD  Eyes:     Conjunctiva/sclera: Conjunctivae normal.     Pupils: Pupils are equal, round, and reactive to light.  Neck:     Thyroid : No thyromegaly.     Vascular: No JVD.  Cardiovascular:     Rate and Rhythm: Normal rate and regular rhythm.     Heart sounds: Normal heart sounds. No murmur heard.    No friction rub. No gallop.  Pulmonary:     Effort: Pulmonary effort is normal. No respiratory distress.     Breath sounds: Normal breath sounds. No wheezing or rales.  Chest:     Chest wall: No tenderness.  Abdominal:     General: Bowel sounds are normal. There is no distension.     Palpations: Abdomen is soft. There is no mass.     Tenderness: There is no abdominal tenderness. There is no  guarding or rebound.  Musculoskeletal:        General: No tenderness. Normal range of motion.     Cervical back: Normal range of motion.  Lymphadenopathy:     Cervical: No cervical adenopathy.  Skin:    General: Skin is warm and dry.     Findings: No rash.  Neurological:     Mental Status: He is alert and oriented to person, place, and time.     Cranial Nerves: No cranial nerve deficit.     Motor: No abnormal muscle tone.     Coordination: Coordination normal.     Gait: Gait normal.     Deep Tendon Reflexes: Reflexes are normal and symmetric.  Psychiatric:        Behavior: Behavior normal.        Thought Content: Thought content normal.        Judgment: Judgment normal.     Lab Results  Component Value Date   WBC 5.5  10/30/2023   HGB 14.7 10/30/2023   HCT 43.4 10/30/2023   PLT 199.0 10/30/2023   GLUCOSE 144 (H) 10/30/2023   CHOL 189 10/30/2023   TRIG 118.0 10/30/2023   HDL 38.80 (L) 10/30/2023   LDLCALC 127 (H) 10/30/2023   ALT 13 10/30/2023   AST 16 10/30/2023   NA 141 10/30/2023   K 4.5 10/30/2023   CL 108 10/30/2023   CREATININE 1.21 10/30/2023   BUN 18 10/30/2023   CO2 25 10/30/2023   TSH 1.16 10/30/2023   PSA 2.31 10/30/2023    MR ABDOMEN W WO CONTRAST Result Date: 04/12/2020 CLINICAL DATA:  Small indeterminate upper right renal lesion on recent CT abdomen/pelvis study. EXAM: MRI ABDOMEN WITHOUT AND WITH CONTRAST TECHNIQUE: Multiplanar multisequence MR imaging of the abdomen was performed both before and after the administration of intravenous contrast. CONTRAST:  18mL MULTIHANCE  GADOBENATE DIMEGLUMINE  529 MG/ML IV SOLN COMPARISON:  03/11/2020 CT abdomen/pelvis. FINDINGS: Lower chest: No acute abnormality at the lung bases. Hepatobiliary: Normal liver size and configuration. No hepatic steatosis. Subcentimeter simple liver cyst in the segment 2 left liver. No suspicious liver masses. There is a 3.1 cm gallstone layering in the nondistended gallbladder with no gallbladder wall thickening or pericholecystic fluid. No biliary ductal dilatation. Common bile duct diameter 5 mm. No choledocholithiasis. No biliary masses, strictures or beading. Pancreas: A few (at least 4) tiny unilocular cystic pancreatic lesions scattered throughout the pancreatic body and tail, largest 0.5 cm in the pancreatic tail (series 7/image 14), none of which demonstrate wall thickening or solid enhancement. No pancreatic duct dilation. No pancreas divisum. Spleen: Normal size. No mass. Adrenals/Urinary Tract: Normal adrenals. No hydronephrosis. Numerous simple renal cortical cysts scattered throughout both kidneys, largest 5.3 x 4.1 cm in the interpolar right kidney. The indeterminate hypodense lateral upper right renal cortical  lesion described on the 03/11/2020 CT abdomen/pelvis study is seen to represent a simple 1.1 cm partially exophytic renal cortical cyst on today's MRI (series 4/image 9). No suspicious renal masses. Stomach/Bowel: Normal non-distended stomach. Visualized small and large bowel is normal caliber, with no bowel wall thickening. Vascular/Lymphatic: Normal caliber abdominal aorta. Patent portal, splenic, hepatic and renal veins. No pathologically enlarged lymph nodes in the abdomen. Other: No abdominal ascites or focal fluid collection. Musculoskeletal: No aggressive appearing focal osseous lesions. IMPRESSION: 1. No suspicious renal masses. Numerous simple renal cysts bilaterally, see comments. 2. A few scattered tiny nonaggressive nonenhancing cystic pancreatic lesions, largest 0.5 cm in the pancreatic tail. No pancreatic duct dilation. Follow-up  MRI abdomen without and with IV contrast is recommended in two years. This recommendation follows ACR consensus guidelines: Management of Incidental Pancreatic Cysts: A White Paper of the ACR Incidental Findings Committee. J Am Coll Radiol 2017;14:911-923. 3. Cholelithiasis. No biliary ductal dilatation. No choledocholithiasis. Electronically Signed   By: Selinda DELENA Blue M.D.   On: 04/12/2020 15:02    Assessment & Plan:   Problem List Items Addressed This Visit     Erectile dysfunction   X 2 years Levitra  prn Labs      HTN (hypertension) - Primary   Change back to Losartan  100 mg/d      Relevant Medications   losartan  (COZAAR ) 100 MG tablet   Hyperglycemia   Dexcom G7      Well adult exam   We discussed age appropriate health related issues, including available/recomended screening tests and vaccinations. We discussed a need for adhering to healthy diet and exercise. Labs/EKG were reviewed/ordered. All questions were answered. Colonoscopy vs cologuard discussed          Meds ordered this encounter  Medications  . losartan  (COZAAR ) 100 MG tablet     Sig: Take 1 tablet (100 mg total) by mouth daily. It is 6 months due to travel    Dispense:  180 tablet    Refill:  3  . pantoprazole  (PROTONIX ) 40 MG tablet    Sig: Take 1 tablet (40 mg total) by mouth daily.    Dispense:  90 tablet    Refill:  3      Follow-up: Return in about 1 year (around 10/30/2024) for Wellness Exam.  Marolyn Noel, MD

## 2023-10-31 NOTE — Assessment & Plan Note (Signed)
We discussed age appropriate health related issues, including available/recomended screening tests and vaccinations. We discussed a need for adhering to healthy diet and exercise. Labs/EKG were reviewed/ordered. All questions were answered. Colonoscopy vs cologuard discussed

## 2023-10-31 NOTE — Assessment & Plan Note (Signed)
X 2 years Levitra prn Labs

## 2023-11-05 ENCOUNTER — Ambulatory Visit: Payer: Self-pay | Admitting: Internal Medicine

## 2023-11-06 ENCOUNTER — Ambulatory Visit
Admission: RE | Admit: 2023-11-06 | Discharge: 2023-11-06 | Disposition: A | Discharge status: Home / Self Care | Source: Ambulatory Visit | Attending: Internal Medicine | Admitting: Internal Medicine

## 2023-11-06 ENCOUNTER — Ambulatory Visit (AMBULATORY_SURGERY_CENTER)

## 2023-11-06 VITALS — Ht 70.87 in | Wt 204.2 lb

## 2023-11-06 DIAGNOSIS — N2889 Other specified disorders of kidney and ureter: Secondary | ICD-10-CM

## 2023-11-06 DIAGNOSIS — K862 Cyst of pancreas: Secondary | ICD-10-CM | POA: Diagnosis not present

## 2023-11-06 DIAGNOSIS — Z860100 Personal history of colon polyps, unspecified: Secondary | ICD-10-CM

## 2023-11-06 MED ORDER — GADOPICLENOL 0.5 MMOL/ML IV SOLN
9.0000 mL | Freq: Once | INTRAVENOUS | Status: AC | PRN
  Administered 2023-11-06: 9 mL via INTRAVENOUS

## 2023-11-06 MED ORDER — NA SULFATE-K SULFATE-MG SULF 17.5-3.13-1.6 GM/177ML PO SOLN: 1.0000 | Freq: Once | ORAL | 0 refills | Status: AC

## 2023-11-06 NOTE — Progress Notes (Signed)
 No egg or soy allergy known to patient  No issues known to pt with past sedation with any surgeries or procedures Patient denies ever being told they had issues or difficulty with intubation  No FH of Malignant Hyperthermia Pt is not on diet pills Pt is not on  home 02  Pt is not on blood thinners  Pt denies issues with constipation  No A fib or A flutter Have any cardiac testing pending--  no  LOA: independent  Prep: suprep   Patient's chart reviewed by Norleen Schillings CNRA prior to previsit and patient appropriate for the [facility].  Previsit completed and red dot placed by patient's name on their procedure day (on provider's schedule).     PV completed with patient. Interpreter present at visit. Prep instructions sent via mychart and hard copy given at [facility] apt.

## 2023-11-07 ENCOUNTER — Ambulatory Visit: Payer: Self-pay | Admitting: Internal Medicine

## 2023-11-07 DIAGNOSIS — K862 Cyst of pancreas: Secondary | ICD-10-CM

## 2023-11-07 DIAGNOSIS — K8020 Calculus of gallbladder without cholecystitis without obstruction: Secondary | ICD-10-CM

## 2023-11-15 ENCOUNTER — Encounter: Payer: Self-pay | Admitting: Gastroenterology

## 2023-11-22 ENCOUNTER — Encounter: Payer: Self-pay | Admitting: Gastroenterology

## 2023-11-22 ENCOUNTER — Ambulatory Visit (AMBULATORY_SURGERY_CENTER): Admitting: Gastroenterology

## 2023-11-22 VITALS — BP 135/76 | HR 50 | Temp 97.9°F | Resp 14 | Ht 70.0 in | Wt 204.2 lb

## 2023-11-22 DIAGNOSIS — Z1211 Encounter for screening for malignant neoplasm of colon: Secondary | ICD-10-CM | POA: Diagnosis not present

## 2023-11-22 DIAGNOSIS — K641 Second degree hemorrhoids: Secondary | ICD-10-CM

## 2023-11-22 DIAGNOSIS — Z860100 Personal history of colon polyps, unspecified: Secondary | ICD-10-CM

## 2023-11-22 DIAGNOSIS — K319 Disease of stomach and duodenum, unspecified: Secondary | ICD-10-CM | POA: Diagnosis not present

## 2023-11-22 DIAGNOSIS — K3189 Other diseases of stomach and duodenum: Secondary | ICD-10-CM | POA: Diagnosis not present

## 2023-11-22 DIAGNOSIS — I10 Essential (primary) hypertension: Secondary | ICD-10-CM | POA: Diagnosis not present

## 2023-11-22 DIAGNOSIS — K2950 Unspecified chronic gastritis without bleeding: Secondary | ICD-10-CM | POA: Diagnosis not present

## 2023-11-22 DIAGNOSIS — D128 Benign neoplasm of rectum: Secondary | ICD-10-CM

## 2023-11-22 DIAGNOSIS — Z8711 Personal history of peptic ulcer disease: Secondary | ICD-10-CM

## 2023-11-22 DIAGNOSIS — D124 Benign neoplasm of descending colon: Secondary | ICD-10-CM | POA: Diagnosis not present

## 2023-11-22 DIAGNOSIS — K449 Diaphragmatic hernia without obstruction or gangrene: Secondary | ICD-10-CM | POA: Diagnosis not present

## 2023-11-22 DIAGNOSIS — K2289 Other specified disease of esophagus: Secondary | ICD-10-CM | POA: Diagnosis not present

## 2023-11-22 DIAGNOSIS — K31A0 Gastric intestinal metaplasia, unspecified: Secondary | ICD-10-CM

## 2023-11-22 DIAGNOSIS — Z860101 Personal history of adenomatous and serrated colon polyps: Secondary | ICD-10-CM

## 2023-11-22 MED ORDER — SODIUM CHLORIDE 0.9 % IV SOLN: 500.0000 mL | Freq: Once | INTRAVENOUS | Status: DC

## 2023-11-22 MED ORDER — SUCRALFATE 1 G PO TABS: 1.0000 g | ORAL_TABLET | Freq: Two times a day (BID) | ORAL | 1 refills | Status: DC

## 2023-11-22 NOTE — Patient Instructions (Addendum)
 Upper Endoscopy  Await results of gastric biopsies Continue PPI twice a day Carafate 1 gm twice a day  Colonoscopy:  High fiber diet. Use FiberCon 1-2 tablets by mouth daily. Continue present medications.  Awaiting pathology results. Repeat colonoscopy in 3 -5 years for surveillance based on pathology results. Handout provided on polyps and hemorrhoids.    YOU HAD AN ENDOSCOPIC PROCEDURE TODAY AT THE [facility]:   Refer to the procedure report that was given to you for any specific questions about what was found during the examination.  If the procedure report does not answer your questions, please call your gastroenterologist to clarify.  If you requested that your care partner not be given the details of your procedure findings, then the procedure report has been included in a sealed envelope for you to review at your convenience later.  YOU SHOULD EXPECT: Some feelings of bloating in the abdomen. Passage of more gas than usual.  Walking can help get rid of the air that was put into your GI tract during the procedure and reduce the bloating. If you had a lower endoscopy (such as a colonoscopy or flexible sigmoidoscopy) you may notice spotting of blood in your stool or on the toilet paper. If you underwent a bowel prep for your procedure, you may not have a normal bowel movement for a few days.  Please Note:  You might notice some irritation and congestion in your nose or some drainage.  This is from the oxygen used during your procedure.  There is no need for concern and it should clear up in a day or so.  SYMPTOMS TO REPORT IMMEDIATELY:  Following lower endoscopy (colonoscopy or flexible sigmoidoscopy):  Excessive amounts of blood in the stool  Significant tenderness or worsening of abdominal pains  Swelling of the abdomen that is new, acute  Fever of 100F or higher  Following upper endoscopy (EGD)  Vomiting of blood or coffee ground material  New chest pain or  pain under the shoulder blades  Painful or persistently difficult swallowing  New shortness of breath  Fever of 100F or higher  Black, tarry-looking stools  For urgent or emergent issues, a gastroenterologist can be reached at any hour by calling [phone]. Do not use MyChart messaging for urgent concerns.    DIET:  We do recommend a small meal at first, but then you may proceed to your regular diet.  Drink plenty of fluids but you should avoid alcoholic beverages for 24 hours.  ACTIVITY:  You should plan to take it easy for the rest of today and you should NOT DRIVE or use heavy machinery until tomorrow (because of the sedation medicines used during the test).    FOLLOW UP: Our staff will call the number listed on your records the next business day following your procedure.  We will call around 7:15- 8:00 am to check on you and address any questions or concerns that you may have regarding the information given to you following your procedure. If we do not reach you, we will leave a message.     If any biopsies were taken you will be contacted by phone or by letter within the next 1-3 weeks.  Please call us  at [phone] if you have not heard about the biopsies in 3 weeks.    SIGNATURES/CONFIDENTIALITY: You and/or your care partner have signed paperwork which will be entered into your electronic medical record.  These signatures attest to the fact that that the  information above on your After Visit Summary has been reviewed and is understood.  Full responsibility of the confidentiality of this discharge information lies with you and/or your care-partner.

## 2023-11-22 NOTE — Progress Notes (Signed)
 A/o x 3, VSS, good SR's, pleased with anesthesia, report to RN

## 2023-11-22 NOTE — Progress Notes (Signed)
 Called to room to assist during endoscopic procedure.  Patient ID and intended procedure confirmed with present staff. Received instructions for my participation in the procedure from the performing physician.

## 2023-11-22 NOTE — Op Note (Signed)
 [facility] Patient Name: Brent Wright Procedure Date: 11/22/2023 11:10 AM MRN: [id number] Endoscopist: [name] , MD, [id number] Age: 73 Referring MD:  Date of Birth: 16-Feb-1950 Gender: Male Account #: [id number] Procedure:                Colonoscopy Indications:              Surveillance: Personal history of adenomatous                            polyps on last colonoscopy 5 years ago, High risk                            colon cancer surveillance: Personal history of                            non-advanced adenoma Medicines:                Monitored Anesthesia Care Procedure:                Pre-Anesthesia Assessment:                           - Prior to the procedure, a History and Physical                            was performed, and patient medications and                            allergies were reviewed. The patient's tolerance of                            previous anesthesia was also reviewed. The risks                            and benefits of the procedure and the sedation                            options and risks were discussed with the patient.                            All questions were answered, and informed consent                            was obtained. Prior Anticoagulants: The patient has                            taken no anticoagulant or antiplatelet agents. ASA                            Grade Assessment: II - A patient with mild systemic                            disease. After reviewing the risks and benefits,  the patient was deemed in satisfactory condition to                            undergo the procedure.                           After obtaining informed consent, the colonoscope                            was passed under direct vision. Throughout the                            procedure, the patient's blood pressure, pulse, and                            oxygen saturations were monitored  continuously. The                            Olympus Scope SN: [id number] was introduced through                            the anus and advanced to the 3 cm into the ileum.                            The colonoscopy was performed without difficulty.                            The patient tolerated the procedure. The quality of                            the bowel preparation was adequate. The terminal                            ileum, ileocecal valve, appendiceal orifice, and                            rectum were photographed. Scope In: 11:44:40 AM Scope Out: 11:56:33 AM Scope Withdrawal Time: 0 hours 9 minutes 51 seconds  Total Procedure Duration: 0 hours 11 minutes 53 seconds  Findings:                 The digital rectal exam findings include                            hemorrhoids. Pertinent negatives include no                            palpable rectal lesions.                           The terminal ileum and ileocecal valve appeared                            normal.  Five sessile polyps were found in the rectum (1)                            and descending colon (4). The polyps were 2 to 5 mm                            in size. These polyps were removed with a cold                            snare. Resection and retrieval were complete.                           Normal mucosa was found in the entire colon                            otherwise.                           Non-bleeding non-thrombosed external and internal                            hemorrhoids were found during retroflexion, during                            perianal exam and during digital exam. The                            hemorrhoids were Grade II (internal hemorrhoids                            that prolapse but reduce spontaneously). Complications:            No immediate complications. Estimated Blood Loss:     Estimated blood loss was minimal. Impression:               - Hemorrhoids  found on digital rectal exam.                           - The examined portion of the ileum was normal.                           - Five 2 to 5 mm polyps in the rectum and in the                            descending colon, removed with a cold snare.                            Resected and retrieved.                           - Normal mucosa in the entire examined colon                            otherwise.                           -  Non-bleeding non-thrombosed external and internal                            hemorrhoids. Recommendation:           - The patient will be observed post-procedure,                            until all discharge criteria are met.                           - Discharge patient to home.                           - Patient has a contact number available for                            emergencies. The signs and symptoms of potential                            delayed complications were discussed with the                            patient. Return to normal activities tomorrow.                            Written discharge instructions were provided to the                            patient.                           - High fiber diet.                           - Use FiberCon 1-2 tablets PO daily.                           - Continue present medications.                           - Await pathology results.                           - Repeat colonoscopy in 3 - 5 years for                            surveillance based on pathology results.                           - The findings and recommendations were discussed                            with the patient.                           - The findings and recommendations were discussed  with the patient's family. [name], MD 11/22/2023 12:00:33 PM

## 2023-11-22 NOTE — Op Note (Signed)
 [facility] Patient Name: Brent Wright Procedure Date: 11/22/2023 11:19 AM MRN: [id number] Endoscopist: [name] , MD, [id number] Age: 73 Referring MD:  Date of Birth: [birth year] Gender: Male Account #: [id number] Procedure:                Upper GI endoscopy Indications:              Follow-up of gastric ulcer, , Follow-up of                            intestinal metaplasia (overdue for surveillance by                            5 years) Medicines:                Monitored Anesthesia Care Procedure:                Pre-Anesthesia Assessment:                           - Prior to the procedure, a History and Physical                            was performed, and patient medications and                            allergies were reviewed. The patient's tolerance of                            previous anesthesia was also reviewed. The risks                            and benefits of the procedure and the sedation                            options and risks were discussed with the patient.                            All questions were answered, and informed consent                            was obtained. Prior Anticoagulants: The patient has                            taken no anticoagulant or antiplatelet agents. ASA                            Grade Assessment: II - A patient with mild systemic                            disease. After reviewing the risks and benefits,                            the patient was deemed in satisfactory condition to  undergo the procedure.                           After obtaining informed consent, the endoscope was                            passed under direct vision. Throughout the                            procedure, the patient's blood pressure, pulse, and                            oxygen saturations were monitored continuously. The                            GIF [id number] was introduced through the                             mouth, and advanced to the second part of duodenum.                            The upper GI endoscopy was accomplished without                            difficulty. The patient tolerated the procedure. Scope In: Scope Out: Findings:                 No gross lesions were noted in the entire esophagus.                           The Z-line was irregular and was found 39 cm from                            the incisors.                           A 1 cm hiatal hernia was present.                           Localized mucosal changes characterized by altered                            texture and ulceration were found on the posterior                            wall of the stomach @ 44cm. This is concerning for                            more than just a gastric ulcer, query if this could                            be dysplastic or possibly early malignant. Biopsies  were taken with a cold forceps for histology. Area                            was tattooed distally with an injection of Spot                            (carbon black).                           Patchy mildly erythematous mucosa without bleeding                            was found in the entire examined stomach. GIM                            surveillance mapping biopsies performed. Biopsies                            were taken with a cold forceps for histology from                            the antrum/incisura. Biopsies were taken with a                            cold forceps for histology from the greater                            curve/lesser curve of the gastric body. Biopsies                            were taken with a cold forceps for histology from                            the cardia and fundus.                           No gross lesions were noted in the duodenal bulb,                            in the first portion of the duodenum and in the                             second portion of the duodenum. Complications:            No immediate complications. Estimated Blood Loss:     Estimated blood loss was minimal. Impression:               - No gross lesions in the entire esophagus. Z-line                            irregular, 39 cm from the incisors.                           - 1 cm hiatal hernia.                           -  Texture changed and ulcerated mucosa in the                            posterior wall of the stomach (@44  cm). Biopsied to                            rule out dysplasia/malignancy. Tattoo placed                            distally.                           - Erythematous mucosa in the stomach throughout.                            Gastric mapping performed due to history of GIM.                           - No gross lesions in the duodenal bulb, in the                            first portion of the duodenum and in the second                            portion of the duodenum. Recommendation:           - Proceed to scheduled colonoscopy.                           - Continue PPI BID.                           - Carafate 1 g twice daily (60/1).                           - Observe patient's clinical course.                           - Await pathology results.                           - Repeat upper endoscopy for surveillance based on                            pathology results.                           - The findings and recommendations were discussed                            with the patient.                           - The findings and recommendations were discussed  with the patient's family. [name], MD 11/22/2023 11:43:11 AM

## 2023-11-22 NOTE — Progress Notes (Signed)
 Pt's states no medical or surgical changes since previsit or office visit.   Interpreter used today at the [facility] for this pt.  Interpreter's name is- Inna

## 2023-11-22 NOTE — Progress Notes (Signed)
 GASTROENTEROLOGY PROCEDURE H&P NOTE   Primary Care Physician: Brent Karlynn GAILS, MD  HPI: Brent Wright is a 73 y.o. male who presents for Colonoscopy +/- EGD for surveillance of previous colon polyps/adenomas and for GIM surveillance and prior gastric ulcer followup.  Past Medical History:  Diagnosis Date   Allergy    seasonal   GERD (gastroesophageal reflux disease)    Glaucoma    Hypertension    Past Surgical History:  Procedure Laterality Date   APPENDECTOMY     HERNIA REPAIR     INGUINAL HERNIA REPAIR Left 02/02/2018   Procedure: OPEN REPAIR OF LEFT INGUINAL HERNIA WITH MESH;  Surgeon: Brent Favorite, MD;  Location: [facility];  Service: General;  Laterality: Left;   INSERTION OF MESH Left 02/02/2018   Procedure: INSERTION OF MESH;  Surgeon: Brent Favorite, MD;  Location: [facility];  Service: General;  Laterality: Left;   Current Outpatient Medications  Medication Sig Dispense Refill   Cholecalciferol (VITAMIN D3) 2000 units capsule Take 1 capsule (2,000 Units total) by mouth daily. 100 capsule 3   latanoprost  (XALATAN ) 0.005 % ophthalmic solution INSTILL 1 DROP INTO BOTH EYES AT BEDTIME 7.5 mL 2   losartan  (COZAAR ) 100 MG tablet Take 1 tablet (100 mg total) by mouth daily. It is 6 months due to travel 180 tablet 3   pantoprazole  (PROTONIX ) 40 MG tablet Take 1 tablet (40 mg total) by mouth daily. 90 tablet 3   omeprazole  (PRILOSEC) 40 MG capsule Take 40 mg by mouth daily.     Current Facility-Administered Medications  Medication Dose Route Frequency Provider Last Rate Last Admin   0.9 %  sodium chloride  infusion  500 mL Intravenous Once Mansouraty, Brittony Billick Jr., MD        Current Outpatient Medications:    Cholecalciferol (VITAMIN D3) 2000 units capsule, Take 1 capsule (2,000 Units total) by mouth daily., Disp: 100 capsule, Rfl: 3   latanoprost  (XALATAN ) 0.005 % ophthalmic solution, INSTILL 1 DROP INTO BOTH EYES AT BEDTIME, Disp: 7.5  mL, Rfl: 2   losartan  (COZAAR ) 100 MG tablet, Take 1 tablet (100 mg total) by mouth daily. It is 6 months due to travel, Disp: 180 tablet, Rfl: 3   pantoprazole  (PROTONIX ) 40 MG tablet, Take 1 tablet (40 mg total) by mouth daily., Disp: 90 tablet, Rfl: 3   omeprazole  (PRILOSEC) 40 MG capsule, Take 40 mg by mouth daily., Disp: , Rfl:   Current Facility-Administered Medications:    0.9 %  sodium chloride  infusion, 500 mL, Intravenous, Once, Mansouraty, Brent Raddle., MD No Known Allergies Family History  Problem Relation Age of Onset   Hypertension Mother    Hypertension Father    Colon cancer Neg Hx    Colon polyps Neg Hx    Esophageal cancer Neg Hx    Rectal cancer Neg Hx    Stomach cancer Neg Hx    Inflammatory bowel disease Neg Hx    Liver disease Neg Hx    Pancreatic cancer Neg Hx    Social History   Socioeconomic History   Marital status: Married    Spouse name: Not on file   Number of children: Not on file   Years of education: Not on file   Highest education level: Master's degree (e.g., MA, MS, MEng, MEd, MSW, MBA)  Occupational History   Not on file  Tobacco Use   Smoking status: Never   Smokeless tobacco: Never  Vaping Use   Vaping status: Never Used  Substance and Sexual Activity   Alcohol use: Yes    Comment: occasional   Drug use: Never   Sexual activity: Yes  Other Topics Concern   Not on file  Social History Narrative   Not on file   Social Drivers of Health   Financial Resource Strain: Medium Risk (10/28/2023)   Overall Financial Resource Strain (CARDIA)    Difficulty of Paying Living Expenses: Somewhat hard  Food Insecurity: Food Insecurity Present (10/28/2023)   Hunger Vital Sign    Worried About Running Out of Food in the Last Year: Often true    Ran Out of Food in the Last Year: Sometimes true  Transportation Needs: No Transportation Needs (10/28/2023)   PRAPARE - Administrator, Civil Service (Medical): No    Lack of  Transportation (Non-Medical): No  Physical Activity: Sufficiently Active (10/28/2023)   Exercise Vital Sign    Days of Exercise per Week: 6 days    Minutes of Exercise per Session: 60 min  Stress: No Stress Concern Present (10/28/2023)   [employer] of Occupational Health - Occupational Stress Questionnaire    Feeling of Stress: Not at all  Social Connections: Unknown (10/28/2023)   Social Connection and Isolation Panel    Frequency of Communication with Friends and Family: Once a week    Frequency of Social Gatherings with Friends and Family: Patient declined    Attends Religious Services: More than 4 times per year    Active Member of [employer] or Organizations: No    Attends Engineer, Structural: Not on file    Marital Status: Married  Catering Manager Violence: Not on file    Physical Exam: Today's Vitals   11/22/23 1038  BP: 134/73  Pulse: 60  Temp: 97.9 F (36.6 C)  SpO2: 98%  Weight: 204 lb 3.2 oz (92.6 kg)  Height: 5' 10 (1.778 m)   Body mass index is 29.3 kg/m. GEN: NAD EYE: Sclerae anicteric ENT: MMM CV: Non-tachycardic GI: Soft, NT/ND NEURO:  Alert & Oriented x 3  Lab Results: No results for input(s): WBC, HGB, HCT, PLT in the last 72 hours. BMET No results for input(s): NA, K, CL, CO2, GLUCOSE, BUN, CREATININE, CALCIUM in the last 72 hours. LFT No results for input(s): PROT, ALBUMIN, AST, ALT, ALKPHOS, BILITOT, BILIDIR, IBILI in the last 72 hours. PT/INR No results for input(s): LABPROT, INR in the last 72 hours.   Impression / Plan: This is a 73 y.o.male who presents for Colonoscopy +/- EGD for surveillance of previous colon polyps/adenomas and for GIM surveillance and prior gastric ulcer followup.  The risks and benefits of endoscopic evaluation/treatment were discussed with the patient and/or family; these include but are not limited to the risk of perforation, infection, bleeding, missed  lesions, lack of diagnosis, severe illness requiring hospitalization, as well as anesthesia and sedation related illnesses.  The patient's history has been reviewed, patient examined, no change in status, and deemed stable for procedure.  The patient and/or family is agreeable to proceed.    Brent Finner, MD [facility] Advanced Endoscopy Office # [phone]

## 2023-11-23 ENCOUNTER — Telehealth: Payer: Self-pay

## 2023-11-23 ENCOUNTER — Ambulatory Visit: Payer: Self-pay | Admitting: Gastroenterology

## 2023-11-23 LAB — SURGICAL PATHOLOGY

## 2023-11-23 NOTE — Telephone Encounter (Signed)
  Follow up Call-     11/22/2023   10:39 AM  Call back number  Post procedure Call Back phone  # [phone]  Permission to leave phone message Yes     Patient questions:  Do you have a fever, pain , or abdominal swelling? No. Pain Score  0 *  Have you tolerated food without any problems? Yes.    Have you been able to return to your normal activities? Yes.    Do you have any questions about your discharge instructions: Diet   No. Medications  No. Follow up visit  No.  Do you have questions or concerns about your Care? No.  Actions: * If pain score is 4 or above: No action needed, pain <4.

## 2023-11-26 ENCOUNTER — Encounter: Payer: Self-pay | Admitting: Gastroenterology

## 2023-11-27 NOTE — Progress Notes (Signed)
See MyChart message for further communication.

## 2023-11-27 NOTE — Telephone Encounter (Signed)
 I had sent a result note to you all about this patient. Can we set him up for clinic this Friday PM, to discuss in detail? Thanks. GM

## 2023-12-01 ENCOUNTER — Ambulatory Visit: Admitting: Gastroenterology

## 2023-12-01 ENCOUNTER — Encounter: Payer: Self-pay | Admitting: Gastroenterology

## 2023-12-01 VITALS — BP 130/72 | HR 76 | Ht 71.0 in | Wt 202.0 lb

## 2023-12-01 DIAGNOSIS — R933 Abnormal findings on diagnostic imaging of other parts of digestive tract: Secondary | ICD-10-CM | POA: Diagnosis not present

## 2023-12-01 DIAGNOSIS — Q403 Congenital malformation of stomach, unspecified: Secondary | ICD-10-CM | POA: Diagnosis not present

## 2023-12-01 DIAGNOSIS — K31A22 Gastric intestinal metaplasia with high grade dysplasia: Secondary | ICD-10-CM | POA: Diagnosis not present

## 2023-12-01 NOTE — Patient Instructions (Addendum)
 We are referring you to [facility].  They will contact you directly to schedule an appointment.  It may take a week or more before you hear from them.  Please feel free to contact us  if you have not heard from them within 2 weeks and we will follow up on the referral.   ______________________________________________________  If your blood pressure at your visit was 140/90 or greater, please contact your primary care physician to follow up on this.  _______________________________________________________  If you are age 21 or older, your body mass index should be between 23-30. Your Body mass index is 28.17 kg/m. If this is out of the aforementioned range listed, please consider follow up with your Primary Care Provider.  If you are age 74 or younger, your body mass index should be between 19-25. Your Body mass index is 28.17 kg/m. If this is out of the aformentioned range listed, please consider follow up with your Primary Care Provider.   ________________________________________________________  [facility] providers would like to encourage you to use MYCHART to communicate with providers for non-urgent requests or questions.  Due to long hold times on the telephone, sending your provider a message by [facility] may be a faster and more efficient way to get a response.  Please allow 48 business hours for a response.  Please remember that this is for non-urgent requests.  _______________________________________________________  [facility] is using a team-based approach to care.  Your team is made up of your doctor and two to three APPS. Our APPS (Nurse Practitioners and Physician Assistants) work with your physician to ensure care continuity for you. They are fully qualified to address your health concerns and develop a treatment plan. They communicate directly with your gastroenterologist to care for you. Seeing the Advanced Practice Practitioners on your physician's team  can help you by facilitating care more promptly, often allowing for earlier appointments, access to diagnostic testing, procedures, and other specialty referrals.   Thank you for choosing me and [facility].  Dr. Wilhelmenia

## 2023-12-02 ENCOUNTER — Encounter: Payer: Self-pay | Admitting: Gastroenterology

## 2023-12-02 DIAGNOSIS — K31A22 Gastric intestinal metaplasia with high grade dysplasia: Secondary | ICD-10-CM | POA: Insufficient documentation

## 2023-12-02 DIAGNOSIS — Q403 Congenital malformation of stomach, unspecified: Secondary | ICD-10-CM | POA: Insufficient documentation

## 2023-12-02 DIAGNOSIS — R933 Abnormal findings on diagnostic imaging of other parts of digestive tract: Secondary | ICD-10-CM | POA: Insufficient documentation

## 2023-12-02 NOTE — Progress Notes (Signed)
 GASTROENTEROLOGY OUTPATIENT CLINIC VISIT   Primary Care Provider Plotnikov, Karlynn GAILS, MD [address] [place] [address] [phone]   Patient Profile: Brent Wright is a 73 y.o. male with a pmh significant for hypertension, glaucoma, seasonal allergies, possible prior H. 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  The patient presents to the [facility] for an evaluation and management of problem(s) noted below:  Problem List 1. Gastric dysplasia   2. 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 Abnormal endoscopy of upper gastrointestinal tract    Discussed the use of AI scribe software for clinical note transcription with the patient, who gave verbal consent to proceed.  History of Present Illness Please see prior progress note for full details of HPI.  Interval History Brent Wright is a 73 year old male with gastric intestinal metaplasia and recent findings of HGD of the proximal stomach who presents for follow-up and result discussion.  He is accompanied by his son and wife today.  They have foregone a medical interpreter, and son will act as translator to his father and his mother.  Patient is currently feeling well.  He is taking this PPI and Carafate as prescribed.  No new issues have developed since his EGD.  We discussed today the results slowly and in detail to help them understand fully the findings of GIM and the precancerous high-grade dysplasia found proximally.   GI Review of Systems Positive as above Negative for nausea, vomiting, abdominal pain, change in bowel movements, melena, hematochezia  Review of Systems General: Denies fevers/chills/weight loss unintentionally Cardiovascular: Denies chest pain/palpitations Pulmonary: Denies shortness of breath Gastroenterological: See  HPI Genitourinary: Denies darkened urine Hematological: Denies easy bruising/bleeding Dermatological: Denies jaundice Psychological: Mood is stable    Medications Current Outpatient Medications  Medication Sig Dispense Refill   Cholecalciferol (VITAMIN D3) 2000 units capsule Take 1 capsule (2,000 Units total) by mouth daily. 100 capsule 3   latanoprost  (XALATAN ) 0.005 % ophthalmic solution INSTILL 1 DROP INTO BOTH EYES AT BEDTIME 7.5 mL 2   losartan  (COZAAR ) 100 MG tablet Take 1 tablet (100 mg total) by mouth daily. It is 6 months due to travel 180 tablet 3   pantoprazole  (PROTONIX ) 40 MG tablet Take 1 tablet (40 mg total) by mouth daily. 90 tablet 3   sucralfate (CARAFATE) 1 g tablet Take 1 tablet (1 g total) by mouth 2 (two) times daily. 60 tablet 1   No current facility-administered medications for this visit.    Allergies No Known Allergies  Histories Past Medical History:  Diagnosis Date   Allergy    seasonal   GERD (gastroesophageal reflux disease)    Glaucoma    Hypertension    Past Surgical History:  Procedure Laterality Date   APPENDECTOMY     HERNIA REPAIR     INGUINAL HERNIA REPAIR Left 02/02/2018   Procedure: OPEN REPAIR OF LEFT INGUINAL HERNIA WITH MESH;  Surgeon: Gail Favorite, MD;  Location: [facility];  Service: General;  Laterality: Left;   INSERTION OF MESH Left 02/02/2018   Procedure: INSERTION OF MESH;  Surgeon: Gail Favorite, MD;  Location: [facility];  Service: General;  Laterality: Left;   Social History   Socioeconomic History   Marital status: Married    Spouse name: Not on file   Number of children: Not on file   Years  of education: Not on file   Highest education level: Master's degree (e.g., MA, MS, MEng, MEd, MSW, MBA)  Occupational History   Not on file  Tobacco Use   Smoking status: Never   Smokeless tobacco: Never  Vaping Use   Vaping status: Never Used  Substance and Sexual Activity   Alcohol  use: Yes    Comment: occasional   Drug use: Never   Sexual activity: Yes  Other Topics Concern   Not on file  Social History Narrative   Not on file   Social Drivers of Health   Financial Resource Strain: Medium Risk (10/28/2023)   Overall Financial Resource Strain (CARDIA)    Difficulty of Paying Living Expenses: Somewhat hard  Food Insecurity: Food Insecurity Present (10/28/2023)   Hunger Vital Sign    Worried About Running Out of Food in the Last Year: Often true    Ran Out of Food in the Last Year: Sometimes true  Transportation Needs: No Transportation Needs (10/28/2023)   PRAPARE - Administrator, Civil Service (Medical): No    Lack of Transportation (Non-Medical): No  Physical Activity: Sufficiently Active (10/28/2023)   Exercise Vital Sign    Days of Exercise per Week: 6 days    Minutes of Exercise per Session: 60 min  Stress: No Stress Concern Present (10/28/2023)   [employer] of Occupational Health - Occupational Stress Questionnaire    Feeling of Stress: Not at all  Social Connections: Unknown (10/28/2023)   Social Connection and Isolation Panel    Frequency of Communication with Friends and Family: Once a week    Frequency of Social Gatherings with Friends and Family: Patient declined    Attends Religious Services: More than 4 times per year    Active Member of [employer] or Organizations: No    Attends Engineer, Structural: Not on file    Marital Status: Married  Catering Manager Violence: Not on file   Family History  Problem Relation Age of Onset   Hypertension Mother    Hypertension Father    Colon cancer Neg Hx    Colon polyps Neg Hx    Esophageal cancer Neg Hx    Rectal cancer Neg Hx    Stomach cancer Neg Hx    Inflammatory bowel disease Neg Hx    Liver disease Neg Hx    Pancreatic cancer Neg Hx    I have reviewed his medical, social, and family history in detail and updated the electronic medical record as necessary.     PHYSICAL EXAMINATION  BP 130/72   Pulse 76   Ht 5' 11 (1.803 m)   Wt 202 lb (91.6 kg)   BMI 28.17 kg/m  Wt Readings from Last 3 Encounters:  12/01/23 202 lb (91.6 kg)  11/22/23 204 lb 3.2 oz (92.6 kg)  11/06/23 204 lb 3.2 oz (92.6 kg)  GEN: NAD, appears stated age, doesn't appear chronically ill PSYCH: Cooperative, without pressured speech EYE: Conjunctivae pink, sclerae anicteric ENT: MMM CV: Nontachycardic RESP: No audible wheezing GI: NABS, soft, NT/ND, without rebound or guarding MSK/EXT: No lower extremity edema SKIN: No jaundice NEURO:  Alert & Oriented x 3, no focal deficits   REVIEW OF DATA  I reviewed the following data at the time of this encounter:  GI Procedures and Studies  2025 EGD - No gross lesions in the entire esophagus. Z-line irregular, 39 cm from the incisors. - 1 cm hiatal hernia. - Texture changed and ulcerated mucosa in the  posterior wall of the stomach (@44  cm). Biopsied to rule out dysplasia/malignancy. Tattoo placed distally. - Erythematous mucosa in the stomach throughout. Gastric mapping performed due to history of GIM. - No gross lesions in the duodenal bulb, in the first portion of the duodenum and in the second portion of the duodenum.  2025 colonoscopy - Hemorrhoids found on digital rectal exam. - The examined portion of the ileum was normal. - Five 2 to 5 mm polyps in the rectum and in the descending colon, removed with a cold snare. Resected and retrieved. - Normal mucosa in the entire examined colon otherwise. - Non-bleeding non-thrombosed external and internal hemorrhoids  Pathology FINAL DIAGNOSIS      1. Stomach, biopsy, antrum and incisura :      GASTRIC ANTRAL MUCOSA WITH HYPEREMIA.      NEGATIVE FOR DYSPLASIA AND INTESTINAL METAPLASIA.      NEGATIVE FOR HELICOBACTER PYLORI.       2. Stomach, biopsy, greater curvature and lesser curvature :      GASTRIC ANTRAL AND OXYNTIC MUCOSA WITH FOCAL MILD CHRONIC INFLAMMATION.       NEGATIVE FOR INTESTINAL METAPLASIA AND DYSPLASIA.      NEGATIVE FOR HELICOBACTER PYLORI.       3. Stomach, biopsy, posterior wall lesion :      GASTRIC MUCOSA WITH FOCAL HIGH-GRADE GLANDULAR DYSPLASIA AND ASSOCIATED      INTESTINAL METAPLASIA.      NEGATIVE FOR INVASIVE CARCINOMA IN THESE BIOPSIES.      SEE NOTE.       4. Stomach, biopsy, cardia and fundus :      FUNDIC MUCOSA WITH FOCAL SLIGHT CHRONIC INFLAMMATION.      NEGATIVE FOR INTESTINAL METAPLASIA AND DYSPLASIA.      NEGATIVE FOR HELICOBACTER PYLORI.       5. 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      BENIGN COLONIC MUCOSA (3).   Laboratory Studies  Reviewed those in epic  Imaging Studies  No relevant studies to review   ASSESSMENT/PLAN  Mr. Fiorello is a 73 y.o. male.  The patient is seen today for evaluation and management of:  1. Gastric dysplasia   2. 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 Abnormal endoscopy of upper gastrointestinal tract    The patient is clinically and hemodynamically stable at this time.   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 The lesion appeared slightly ulcerated/depressed but findings showed this was GIM with HGD.  Sample error can occur as well, so underlying malignancy should still be kept in mind until final pathology returns.  There is a risk of progression to gastric cancer if untreated. The lesion may not be fully captured by typical EMR so if an attempt at EnBloc resection via ESD could be pursued, I think that is most ideal for the patient to undergo.  I do not perform ESD nor does any GI provider in [place].  I would like for him to be seen at [facility] with one of their advanced endoscopists to see about attempting ESD.  Follow-up surveillance to be dictated after the therapeutic  endoscopic intervention has been completed.  Discussed potential risks of resection including perforation, infection, bleeding, aspiration, medication effects.  They agree to moving forward with the procedure. - Refer to advanced endoscopist for endoscopic submucosal dissection (  ESD) at [facility] with Dr. Hezekiah. Elta. -- If insurance requires sending elsewhere then will make referral to [facility] or [facility].   - Continue Protonix  daily. - Complete Carafate twice daily for one month. - Will schedule follow-up endoscopy 3-6 months post-procedure most likely but let's get this removed first.    Orders Placed This Encounter  Procedures   Ambulatory referral to Gastroenterology    New Prescriptions   No medications on file   Modified Medications   No medications on file    Planned Follow Up No follow-ups on file.   Total Time in Face-to-Face and in Coordination of Care for patient including independent/personal interpretation/review of prior testing, medical history, examination, medication adjustment, communicating results with the patient directly, and documentation with the EHR is 35 minutes (as a result of having the son interpret everything to the patient and mother for the clinic visit as well).   [name], MD [facility] Advanced Endoscopy Office # [phone]

## 2023-12-08 ENCOUNTER — Encounter: Payer: Self-pay | Admitting: Gastroenterology

## 2023-12-08 ENCOUNTER — Telehealth: Payer: Self-pay | Admitting: Gastroenterology

## 2023-12-08 NOTE — Telephone Encounter (Signed)
 Inbound call from patient stating that he has not heard from [facility] in regards to his referral. Patient is requesting a call back. Please advise.

## 2023-12-12 NOTE — Telephone Encounter (Signed)
 Inbound call from patient again requesting to know why [facility] has not reached out to him as of yet. Patient is very concerned and wishing to speak to someone is regards to this situation. Patient would like a call back at [phone]. Please advise.

## 2023-12-12 NOTE — Telephone Encounter (Signed)
 Inbound call from patient son stating that his father insurance is not in network with [facility] and would like to speak to Rovanda in regards to his previous message sent through MyChart. Please advise.

## 2023-12-12 NOTE — Telephone Encounter (Signed)
 Referral been faxed to [facility]. Patient has been informed that it may take 10-14 days before he hears from [facility]. Patient voiced understanding.

## 2023-12-12 NOTE — Telephone Encounter (Signed)
 Rovonda, Please send this patient to [facility] advanced endoscopy Dr. Euel or Dr. Verner. May also send referral to [facility], Dr. Alois. Thanks. GM

## 2023-12-14 ENCOUNTER — Other Ambulatory Visit: Payer: Self-pay | Admitting: Gastroenterology

## 2023-12-14 DIAGNOSIS — Z8711 Personal history of peptic ulcer disease: Secondary | ICD-10-CM

## 2023-12-14 DIAGNOSIS — K319 Disease of stomach and duodenum, unspecified: Secondary | ICD-10-CM

## 2023-12-18 ENCOUNTER — Encounter: Payer: Self-pay | Admitting: Gastroenterology

## 2023-12-19 NOTE — Telephone Encounter (Signed)
 Dr Wilhelmenia you sent #[phone] with a refill of carafate . Do you want him to continue for another month?

## 2023-12-19 NOTE — Telephone Encounter (Signed)
 It is possible that I signed this as a refill request came through. I had wanted him to only be on 30 days of this from the time of his initial endoscopy. He does not need a refill of it if he completed the Carafate  completely for 30 days. Thanks. GM

## 2023-12-21 ENCOUNTER — Encounter (HOSPITAL_BASED_OUTPATIENT_CLINIC_OR_DEPARTMENT_OTHER): Payer: Self-pay | Admitting: General Surgery
